# Patient Record
Sex: Male | Born: 1985 | Race: Black or African American | Hispanic: No | Marital: Single | Smoking: Current every day smoker
Health system: Southern US, Community
[De-identification: ages and names within clinical notes are randomized; demographics above are authoritative.]

## PROBLEM LIST (undated history)

## (undated) HISTORY — PX: ROOT CANAL: SHX2363

---
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
## 2007-02-23 ENCOUNTER — Emergency Department: Payer: Self-pay | Admitting: Emergency Medicine

## 2007-06-03 ENCOUNTER — Emergency Department: Payer: Self-pay | Admitting: Emergency Medicine

## 2007-08-30 ENCOUNTER — Emergency Department: Payer: Self-pay | Admitting: Unknown Physician Specialty

## 2007-09-25 ENCOUNTER — Emergency Department: Payer: Self-pay | Admitting: Emergency Medicine

## 2009-01-10 ENCOUNTER — Emergency Department: Payer: Self-pay | Admitting: Internal Medicine

## 2009-09-15 ENCOUNTER — Emergency Department: Payer: Self-pay | Admitting: Emergency Medicine

## 2010-07-22 ENCOUNTER — Emergency Department: Payer: Self-pay | Admitting: Emergency Medicine

## 2015-12-02 ENCOUNTER — Emergency Department
Admission: EM | Admit: 2015-12-02 | Discharge: 2015-12-02 | Disposition: A | Discharge status: Home / Self Care | Payer: Self-pay | Attending: Emergency Medicine | Admitting: Emergency Medicine

## 2015-12-02 DIAGNOSIS — F17210 Nicotine dependence, cigarettes, uncomplicated: Secondary | ICD-10-CM | POA: Insufficient documentation

## 2015-12-02 DIAGNOSIS — W57XXXA Bitten or stung by nonvenomous insect and other nonvenomous arthropods, initial encounter: Secondary | ICD-10-CM

## 2015-12-02 DIAGNOSIS — L03116 Cellulitis of left lower limb: Secondary | ICD-10-CM | POA: Insufficient documentation

## 2015-12-02 MED ORDER — NAPROXEN 500 MG PO TABS
500.0000 mg | ORAL_TABLET | Freq: Once | ORAL | Status: AC
  Administered 2015-12-02: 500 mg via ORAL
  Filled 2015-12-02: qty 1

## 2015-12-02 MED ORDER — NAPROXEN 500 MG PO TABS: 500.0000 mg | ORAL_TABLET | Freq: Two times a day (BID) | ORAL | Status: DC

## 2015-12-02 MED ORDER — SULFAMETHOXAZOLE-TRIMETHOPRIM 800-160 MG PO TABS: 1.0000 | ORAL_TABLET | Freq: Two times a day (BID) | ORAL | 0 refills | Status: DC

## 2015-12-02 MED ORDER — TRAMADOL HCL 50 MG PO TABS: 50.0000 mg | ORAL_TABLET | Freq: Four times a day (QID) | ORAL | 0 refills | Status: DC | PRN

## 2015-12-02 MED ORDER — SULFAMETHOXAZOLE-TRIMETHOPRIM 800-160 MG PO TABS
1.0000 | ORAL_TABLET | Freq: Once | ORAL | Status: AC
  Administered 2015-12-02: 1 via ORAL
  Filled 2015-12-02: qty 1

## 2015-12-02 NOTE — ED Triage Notes (Signed)
Pt reports he noticed a possible bite to left lateral calf Wednesday night. He "popped" bite and Thursday area became red/raised and painful

## 2015-12-02 NOTE — Discharge Instructions (Signed)
Apply warm compresses to the area for 5-10 minutes every 6 hours.

## 2015-12-02 NOTE — ED Provider Notes (Signed)
[facility] Emergency Department Provider Note   ____________________________________________   First MD Initiated Contact with Patient 12/02/15 1103     (approximate)  I have reviewed the triage vital signs and the nursing notes.   HISTORY  Chief Complaint No chief complaint on file.    HPI Ronald Baker is a 30 y.o. male patient complaining pain and edema to the left lower leg for 2 days. Patient suspect insect bite which occurred 3 days ago. Patient stated noticed pimple at the site and a tries to rupture the lesion. Patient state last night increased pain and edema. Patient rates his pain as a 7/10. No palliative measures taken for this complaint.   No past medical history on file.  There are no active problems to display for this patient.   No past surgical history on file.  Prior to Admission medications   Medication Sig Start Date End Date Taking? Authorizing Provider  naproxen (NAPROSYN) 500 MG tablet Take 1 tablet (500 mg total) by mouth 2 (two) times daily with a meal. 12/02/15   Joni Reiningonald K Ralphine Hinks, PA-C  sulfamethoxazole-trimethoprim (BACTRIM DS,SEPTRA DS) 800-160 MG tablet Take 1 tablet by mouth 2 (two) times daily. 12/02/15   Joni Reiningonald K Maire Govan, PA-C  traMADol (ULTRAM) 50 MG tablet Take 1 tablet (50 mg total) by mouth every 6 (six) hours as needed for moderate pain. 12/02/15   Joni Reiningonald K Evo Aderman, PA-C    Allergies Patient has no allergy information on record.  No family history on file.  Social History Social History  Substance Use Topics  . Smoking status: Not on file  . Smokeless tobacco: Not on file  . Alcohol use Not on file    Review of Systems Constitutional: No fever/chills Eyes: No visual changes. ENT: No sore throat. Cardiovascular: Denies chest pain. Respiratory: Denies shortness of breath. Gastrointestinal: No abdominal pain.  No nausea, no vomiting.  No diarrhea.  No constipation. Genitourinary: Negative for  dysuria. Musculoskeletal: Negative for back pain. Skin: Edema and redness to the lateral aspect of left lower leg.  Neurological: Negative for headaches, focal weakness or numbness.    ____________________________________________   PHYSICAL EXAM:  VITAL SIGNS: ED Triage Vitals  Enc Vitals Group     BP      Pulse      Resp      Temp      Temp src      SpO2      Weight      Height      Head Circumference      Peak Flow      Pain Score      Pain Loc      Pain Edu?      Excl. in GC?     Constitutional: Alert and oriented. Well appearing and in no acute distress. Eyes: Conjunctivae are normal. PERRL. EOMI. Head: Atraumatic. Nose: No congestion/rhinnorhea. Mouth/Throat: Mucous membranes are moist.  Oropharynx non-erythematous. Neck: No stridor. No cervical spine tenderness to palpation Hematological/Lymphatic/Immunilogical: No cervical lymphadenopathy. Cardiovascular: Normal rate, regular rhythm. Grossly normal heart sounds.  Good peripheral circulation. Respiratory: Normal respiratory effort.  No retractions. Lungs CTAB. Gastrointestinal: Soft and nontender. No distention. No abdominal bruits. No CVA tenderness. Musculoskeletal: No lower extremity tenderness nor edema.  No joint effusions. Neurologic:  Normal speech and language. No gross focal neurologic deficits are appreciated. No gait instability. Skin:  Skin is warm, dry and intact. No rash noted.Nonfluctuant nodule lesion lateral aspect of left lower leg. Psychiatric:  Mood and affect are normal. Speech and behavior are normal.  ____________________________________________   LABS (all labs ordered are listed, but only abnormal results are displayed)  Labs Reviewed - No data to display ____________________________________________  EKG   ____________________________________________  RADIOLOGY   ____________________________________________   PROCEDURES  Procedure(s) performed:  None  Procedures  Critical Care performed: No  ____________________________________________   INITIAL IMPRESSION / ASSESSMENT AND PLAN / ED COURSE  Pertinent labs & imaging results that were available during my care of the patient were reviewed by me and considered in my medical decision making (see chart for details).  Cellulitis secondary to suspect insect bite. Patient given discharge care instructions. Patient given prescription of Bactrim, naproxen, tramadol. Patient advised to follow-up with "clinic condition persists.  Clinical Course      ____________________________________________   FINAL CLINICAL IMPRESSION(S) / ED DIAGNOSES  Final diagnoses:  Bug bite with infection, initial encounter      NEW MEDICATIONS STARTED DURING THIS VISIT:  New Prescriptions   NAPROXEN (NAPROSYN) 500 MG TABLET    Take 1 tablet (500 mg total) by mouth 2 (two) times daily with a meal.   SULFAMETHOXAZOLE-TRIMETHOPRIM (BACTRIM DS,SEPTRA DS) 800-160 MG TABLET    Take 1 tablet by mouth 2 (two) times daily.   TRAMADOL (ULTRAM) 50 MG TABLET    Take 1 tablet (50 mg total) by mouth every 6 (six) hours as needed for moderate pain.     Note:  This document was prepared using Dragon voice recognition software and may include unintentional dictation errors.    Joni ReiningRonald K Makina Skow, PA-C 12/02/15 1112    Jennye MoccasinBrian S Quigley, MD 12/02/15 1248

## 2015-12-06 ENCOUNTER — Emergency Department
Admission: EM | Admit: 2015-12-06 | Discharge: 2015-12-06 | Disposition: A | Discharge status: Home / Self Care | Payer: Self-pay | Attending: Emergency Medicine | Admitting: Emergency Medicine

## 2015-12-06 ENCOUNTER — Encounter: Payer: Self-pay | Admitting: Emergency Medicine

## 2015-12-06 DIAGNOSIS — F17210 Nicotine dependence, cigarettes, uncomplicated: Secondary | ICD-10-CM | POA: Insufficient documentation

## 2015-12-06 DIAGNOSIS — L03116 Cellulitis of left lower limb: Secondary | ICD-10-CM

## 2015-12-06 LAB — BASIC METABOLIC PANEL
Anion gap: 7 (ref 5–15)
BUN: 9 mg/dL (ref 6–20)
CHLORIDE: 102 mmol/L (ref 101–111)
CO2: 26 mmol/L (ref 22–32)
CREATININE: 1.17 mg/dL (ref 0.61–1.24)
Calcium: 9.7 mg/dL (ref 8.9–10.3)
Glucose, Bld: 97 mg/dL (ref 65–99)
POTASSIUM: 4.2 mmol/L (ref 3.5–5.1)
SODIUM: 135 mmol/L (ref 135–145)

## 2015-12-06 LAB — CBC WITH DIFFERENTIAL/PLATELET
BASOS ABS: 0 10*3/uL (ref 0–0.1)
Basophils Relative: 0 %
EOS ABS: 0 10*3/uL (ref 0–0.7)
Eosinophils Relative: 0 %
HCT: 42.9 % (ref 40.0–52.0)
HEMOGLOBIN: 14.6 g/dL (ref 13.0–18.0)
LYMPHS ABS: 1.2 10*3/uL (ref 1.0–3.6)
Lymphocytes Relative: 16 %
MCH: 30.3 pg (ref 26.0–34.0)
MCHC: 34 g/dL (ref 32.0–36.0)
MCV: 89 fL (ref 80.0–100.0)
Monocytes Absolute: 0.7 10*3/uL (ref 0.2–1.0)
Monocytes Relative: 9 %
NEUTROS PCT: 75 %
Neutro Abs: 5.8 10*3/uL (ref 1.4–6.5)
Platelets: 251 10*3/uL (ref 150–440)
RBC: 4.82 MIL/uL (ref 4.40–5.90)
RDW: 13.1 % (ref 11.5–14.5)
WBC: 7.8 10*3/uL (ref 3.8–10.6)

## 2015-12-06 MED ORDER — CLINDAMYCIN HCL 300 MG PO CAPS: 300.0000 mg | ORAL_CAPSULE | Freq: Three times a day (TID) | ORAL | 0 refills | Status: DC

## 2015-12-06 MED ORDER — TRAMADOL HCL 50 MG PO TABS: 50.0000 mg | ORAL_TABLET | Freq: Four times a day (QID) | ORAL | 0 refills | Status: DC | PRN

## 2015-12-06 MED ORDER — CLINDAMYCIN PHOSPHATE 600 MG/50ML IV SOLN
600.0000 mg | Freq: Once | INTRAVENOUS | Status: AC
  Administered 2015-12-06: 600 mg via INTRAVENOUS
  Filled 2015-12-06: qty 50

## 2015-12-06 NOTE — ED Provider Notes (Signed)
[facility] Emergency Department Provider Note  ____________________________________________  Time seen: Approximately 9:45 AM  I have reviewed the triage vital signs and the nursing notes.   HISTORY  Chief Complaint Abscess    HPI Ronald Baker is a 30 y.o. male , NAD, presents to the emergency department with redness and swelling around a skin sore about his left lower leg. Patient states she was seen in the emergency department a few days ago for what he thought was a spider bite. Was prescribed antibiotics and pain medications at that time. States the area has begun to ooze but has more redness, pain and swelling than it did previously. Has been taking his medications as prescribed. Denies any chest pain, shortness breath, abdominal pain, nausea or vomiting. Has had no swelling about his feet or toes. Denies any fevers, chills or body aches. Has not had follow-up since being seen in this emergency department a few days ago. Denies any new injuries or traumas.   History reviewed. No pertinent past medical history.  There are no active problems to display for this patient.   Past Surgical History:  Procedure Laterality Date  . ROOT CANAL      Prior to Admission medications   Medication Sig Start Date End Date Taking? Authorizing Provider  clindamycin (CLEOCIN) 300 MG capsule Take 1 capsule (300 mg total) by mouth 3 (three) times daily. 12/06/15   Mkayla Steele L Michelle Vanhise, PA-C  naproxen (NAPROSYN) 500 MG tablet Take 1 tablet (500 mg total) by mouth 2 (two) times daily with a meal. 12/02/15   Joni Reiningonald K Smith, PA-C  traMADol (ULTRAM) 50 MG tablet Take 1 tablet (50 mg total) by mouth every 6 (six) hours as needed. 12/06/15   Dameka Younker L Chalese Peach, PA-C    Allergies Patient has no known allergies.  Family History  Problem Relation Age of Onset  . Cancer Mother     Social History Social History  Substance Use Topics  . Smoking status: Current Every Day Smoker   Packs/day: 1.00    Years: 10.00    Types: Cigarettes  . Smokeless tobacco: Never Used  . Alcohol use 2.4 oz/week    2 Shots of liquor, 2 Cans of beer per week     Review of Systems  Constitutional: No fever/chills Cardiovascular: No chest pain. Respiratory:No shortness of breath. No wheezing.  Gastrointestinal: No abdominal pain.  No nausea, vomiting.   Musculoskeletal: Positive left lower leg pain associated with skin sore.  Skin: Positive skin sore with surrounding redness and swelling about the left lower leg with active oozing. No new rashes, skin sores, bruising. Neurological: Negative for numbness, weakness, tingling. 10-point ROS otherwise negative.  ____________________________________________   PHYSICAL EXAM:  VITAL SIGNS: ED Triage Vitals  Enc Vitals Group     BP      Pulse      Resp      Temp      Temp src      SpO2      Weight      Height      Head Circumference      Peak Flow      Pain Score      Pain Loc      Pain Edu?      Excl. in GC?     Constitutional: Alert and oriented. Well appearing and in no acute distress. Eyes: Conjunctivae are normal Without icterus or injection Head: Atraumatic. Cardiovascular: Normal rate, regular rhythm. Normal S1 and S2.  No murmurs, rubs, gallops. Good peripheral circulation With 2+ pulses noted in the left lower extremity. Capillary refill is brisk in all digits of left foot. Respiratory: Normal respiratory effort without tachypnea or retractions. Lungs CTAB with breath sounds noted in all lung fields. Musculoskeletal: No lower extremity edema.  No joint effusions.Compartments about the left lower extremity are soft. Neurologic:  Normal speech and language. No gross focal neurologic deficits are appreciated.  Skin:  0.8cm oozing skin sore noted about the left lateral lower leg. Surrounding erythema and cellulitis is noted without induration or fluctuance measuring approximately 4 cm in diameter. Skin is warm, dry. No  rash noted. Psychiatric: Mood and affect are normal. Speech and behavior are normal. Patient exhibits appropriate insight and judgement.   ____________________________________________   LABS (all labs ordered are listed, but only abnormal results are displayed)  Labs Reviewed  BASIC METABOLIC PANEL  CBC WITH DIFFERENTIAL/PLATELET   ____________________________________________  EKG  None ____________________________________________  RADIOLOGY  None ____________________________________________    PROCEDURES  Procedure(s) performed: None   Procedures   Medications  clindamycin (CLEOCIN) IVPB 600 mg (0 mg Intravenous Stopped 12/06/15 1129)     ____________________________________________   INITIAL IMPRESSION / ASSESSMENT AND PLAN / ED COURSE  Pertinent labs & imaging results that were available during my care of the patient were reviewed by me and considered in my medical decision making (see chart for details).  Clinical Course     Patient's diagnosis is consistent with Cellulitis of left lower show many. Laboratory results were reassuring and all within normal limits. Patient's physical exam is consistent with cellulitis without abscess that needed incision and drainage. Patient tolerated IV clindamycin in the emergency department well without immediate side effects. We'll change at home prescriptions to clindamycin as well as refill tramadol to take as needed for pain. Patient verbalizes understanding that he will [facility]. Permanent marker was used to outline the area of redness and swelling.  Patient is given ED precautions to return to the ED for any worsening or new symptoms.    ____________________________________________  FINAL CLINICAL IMPRESSION(S) / ED DIAGNOSES  Final diagnoses:  Cellulitis of left lower extremity      NEW MEDICATIONS STARTED DURING THIS VISIT:  New Prescriptions    CLINDAMYCIN (CLEOCIN) 300 MG CAPSULE    Take 1 capsule (300 mg total) by mouth 3 (three) times daily.   TRAMADOL (ULTRAM) 50 MG TABLET    Take 1 tablet (50 mg total) by mouth every 6 (six) hours as needed.         Hope PigeonJami L Cybele Maule, PA-C 12/06/15 1136    Sharman CheekPhillip Stafford, MD 12/07/15 [phone]

## 2015-12-06 NOTE — ED Triage Notes (Signed)
Possible insect bite to left lower leg about week ago  Area is larger and more swollen  Min draining noted

## 2016-05-20 ENCOUNTER — Encounter (HOSPITAL_COMMUNITY): Payer: Self-pay | Admitting: Emergency Medicine

## 2016-05-20 ENCOUNTER — Emergency Department (HOSPITAL_COMMUNITY)
Admission: EM | Admit: 2016-05-20 | Discharge: 2016-05-20 | Disposition: A | Discharge status: Home / Self Care | Payer: Self-pay | Attending: Emergency Medicine | Admitting: Emergency Medicine

## 2016-05-20 DIAGNOSIS — Y999 Unspecified external cause status: Secondary | ICD-10-CM | POA: Insufficient documentation

## 2016-05-20 DIAGNOSIS — Y929 Unspecified place or not applicable: Secondary | ICD-10-CM | POA: Insufficient documentation

## 2016-05-20 DIAGNOSIS — S2195XA Open bite of unspecified part of thorax, initial encounter: Secondary | ICD-10-CM | POA: Insufficient documentation

## 2016-05-20 DIAGNOSIS — W57XXXA Bitten or stung by nonvenomous insect and other nonvenomous arthropods, initial encounter: Secondary | ICD-10-CM | POA: Insufficient documentation

## 2016-05-20 DIAGNOSIS — F17210 Nicotine dependence, cigarettes, uncomplicated: Secondary | ICD-10-CM | POA: Insufficient documentation

## 2016-05-20 DIAGNOSIS — Y939 Activity, unspecified: Secondary | ICD-10-CM | POA: Insufficient documentation

## 2016-05-20 NOTE — ED Provider Notes (Signed)
[facility]-EMERGENCY DEPT Provider Note    By signing my name below, I, Earmon Phoenix, attest that this documentation has been prepared under the direction and in the presence of Terance Hart, PA-C. Electronically Signed: Earmon Phoenix, ED Scribe. 05/20/16. 1:05 PM.    History   Chief Complaint Chief Complaint  Patient presents with  . Insect Bite   The history is provided by the patient and medical records. No language interpreter was used.    Ronald Baker is a 31 y.o. male who presents to the Emergency Department complaining of an insect bites to the left lateral chest wall and to the right fifth digit and hand that occurred yesterday. He reports associated itching and pain around the areas. He states he is currently staying at his sister's house but denies anyone else in the house has bites. There are no animals in the house. He has not taken anything for symptom relief. There are no modifying factors noted. He denies fever, chills, nausea, vomiting or open wounds.   History reviewed. No pertinent past medical history.  There are no active problems to display for this patient.   Past Surgical History:  Procedure Laterality Date  . ROOT CANAL         Home Medications    Prior to Admission medications   Medication Sig Start Date End Date Taking? Authorizing Provider  clindamycin (CLEOCIN) 300 MG capsule Take 1 capsule (300 mg total) by mouth 3 (three) times daily. 12/06/15   Hagler, Jami L, PA-C  naproxen (NAPROSYN) 500 MG tablet Take 1 tablet (500 mg total) by mouth 2 (two) times daily with a meal. 12/02/15   Joni Reining, PA-C  traMADol (ULTRAM) 50 MG tablet Take 1 tablet (50 mg total) by mouth every 6 (six) hours as needed. 12/06/15   Hagler, Ernestene Kiel, PA-C    Family History Family History  Problem Relation Age of Onset  . Cancer Mother     Social History Social History  Substance Use Topics  . Smoking status: Current Every Day Smoker    Packs/day: 1.00   Years: 10.00    Types: Cigarettes  . Smokeless tobacco: Never Used  . Alcohol use 2.4 oz/week    2 Shots of liquor, 2 Cans of beer per week     Allergies   Patient has no known allergies.   Review of Systems Review of Systems  Constitutional: Negative for chills and fever.  Gastrointestinal: Negative for nausea and vomiting.  Skin: Positive for color change. Negative for wound.     Physical Exam Updated Vital Signs BP (!) 136/91   Pulse 87   Temp 98.3 F (36.8 C) (Oral)   Resp 18   Ht 5\' 9"  (1.753 m)   Wt 161 lb (73 kg)   SpO2 100%   BMI 23.78 kg/m   Physical Exam  Constitutional: He is oriented to person, place, and time. He appears well-developed and well-nourished. No distress.  HENT:  Head: Normocephalic and atraumatic.  Eyes: Conjunctivae are normal. Pupils are equal, round, and reactive to light. Right eye exhibits no discharge. Left eye exhibits no discharge. No scleral icterus.  Neck: Normal range of motion.  Cardiovascular: Normal rate.   Pulmonary/Chest: Effort normal. No respiratory distress.  Abdominal: He exhibits no distension.  Musculoskeletal: Normal range of motion.  Neurological: He is alert and oriented to person, place, and time.  Skin: Skin is warm and dry.  Scattered, erythematous papules of the dorsal right hand consistent with bite marks.  Bite mark to left lateral chest wall.  Psychiatric: He has a normal mood and affect. His behavior is normal.  Nursing note and vitals reviewed.    ED Treatments / Results  DIAGNOSTIC STUDIES: Oxygen Saturation is 100% on RA, normal by my interpretation.   COORDINATION OF CARE: 1:02 PM- Recommended OTC Benadryl and OTC steroid cream. Instructed pt to apply ice to the areas to help reduce pain and itching. Pt verbalizes understanding and agrees to plan.  Medications - No data to display  Labs (all labs ordered are listed, but only abnormal results are displayed) Labs Reviewed - No data to  display  EKG  EKG Interpretation None       Radiology No results found.  Procedures Procedures (including critical care time)  Medications Ordered in ED Medications - No data to display   Initial Impression / Assessment and Plan / ED Course  I have reviewed the triage vital signs and the nursing notes.  Pertinent labs & imaging results that were available during my care of the patient were reviewed by me and considered in my medical decision making (see chart for details).  31 year old male with multiple insect bite wounds. Advised Benadryl and steroid cream for itching/inflammation as well as cool compresses. Return precautions given.   Final Clinical Impressions(s) / ED Diagnoses   Final diagnoses:  Insect bite, initial encounter    New Prescriptions New Prescriptions   No medications on file   I personally performed the services described in this documentation, which was scribed in my presence. The recorded information has been reviewed and is accurate.    Bethel BornGekas, Elsye Mccollister Marie, PA-C 05/20/16 1338    Raeford RazorKohut, Stephen, MD 05/20/16 [phone]

## 2016-05-20 NOTE — Discharge Instructions (Signed)
Use Hydrocortisone 1% cream twice a day on bites Take Benadryl for itching Use ice to reduce swelling and redness

## 2016-05-20 NOTE — ED Triage Notes (Signed)
Pt reports noticed a bug bit to his left side early yesterday, today pt woke up with redness to right pinky finger from another possible bug bite.

## 2016-05-20 NOTE — ED Notes (Signed)
Declined W/C at D/C and was escorted to lobby by RN. 

## 2017-05-20 ENCOUNTER — Emergency Department: Payer: Self-pay

## 2017-05-20 ENCOUNTER — Encounter: Payer: Self-pay | Admitting: Emergency Medicine

## 2017-05-20 ENCOUNTER — Emergency Department
Admission: EM | Admit: 2017-05-20 | Discharge: 2017-05-20 | Disposition: A | Discharge status: Home / Self Care | Payer: Self-pay | Attending: Emergency Medicine | Admitting: Emergency Medicine

## 2017-05-20 ENCOUNTER — Other Ambulatory Visit: Payer: Self-pay

## 2017-05-20 DIAGNOSIS — S60221A Contusion of right hand, initial encounter: Secondary | ICD-10-CM | POA: Insufficient documentation

## 2017-05-20 DIAGNOSIS — Y999 Unspecified external cause status: Secondary | ICD-10-CM | POA: Insufficient documentation

## 2017-05-20 DIAGNOSIS — W208XXA Other cause of strike by thrown, projected or falling object, initial encounter: Secondary | ICD-10-CM | POA: Insufficient documentation

## 2017-05-20 DIAGNOSIS — Y929 Unspecified place or not applicable: Secondary | ICD-10-CM | POA: Insufficient documentation

## 2017-05-20 DIAGNOSIS — F17210 Nicotine dependence, cigarettes, uncomplicated: Secondary | ICD-10-CM | POA: Insufficient documentation

## 2017-05-20 DIAGNOSIS — Y939 Activity, unspecified: Secondary | ICD-10-CM | POA: Insufficient documentation

## 2017-05-20 DIAGNOSIS — Z79899 Other long term (current) drug therapy: Secondary | ICD-10-CM | POA: Insufficient documentation

## 2017-05-20 MED ORDER — MELOXICAM 15 MG PO TABS: 15.0000 mg | ORAL_TABLET | Freq: Every day | ORAL | 1 refills | Status: AC

## 2017-05-20 NOTE — ED Triage Notes (Signed)
Pt to ED from home c/o right hand injury that happened Saturday but has not seen resolve.  States car rim came down on top of patient's hand causing injury to first two proximal knuckles right hand, states swelling and abrasion.

## 2017-05-20 NOTE — ED Provider Notes (Signed)
[facility] Emergency Department Provider Note  ____________________________________________  Time seen: Approximately 3:20 PM  I have reviewed the triage vital signs and the nursing notes.   HISTORY  Chief Complaint Hand Pain    HPI Ronald Baker is a 32 y.o. male presents to the emergency department with 10 out of 10 right second and third digit pain after patient reports that he was working on his car and a car rim dropped on his hand.  Patient has not experienced right hand avoidance.  He has kept right second and third digits splinted since Saturday and has noticed some stiffness since splinting has occurred.  No weakness or changes in sensation in the upper extremities.  Patient did sustain 2 small abrasions along the dorsal aspect of the affected digits.   History reviewed. No pertinent past medical history.  There are no active problems to display for this patient.   Past Surgical History:  Procedure Laterality Date  . ROOT CANAL      Prior to Admission medications   Medication Sig Start Date End Date Taking? Authorizing Provider  clindamycin (CLEOCIN) 300 MG capsule Take 1 capsule (300 mg total) by mouth 3 (three) times daily. 12/06/15   Hagler, Jami L, PA-C  meloxicam (MOBIC) 15 MG tablet Take 1 tablet (15 mg total) by mouth daily for 7 days. 05/20/17 05/27/17  Orvil Feil, PA-C  naproxen (NAPROSYN) 500 MG tablet Take 1 tablet (500 mg total) by mouth 2 (two) times daily with a meal. 12/02/15   Joni Reining, PA-C  traMADol (ULTRAM) 50 MG tablet Take 1 tablet (50 mg total) by mouth every 6 (six) hours as needed. 12/06/15   Hagler, Jami L, PA-C    Allergies Patient has no known allergies.  Family History  Problem Relation Age of Onset  . Cancer Mother     Social History Social History   Tobacco Use  . Smoking status: Current Every Day Smoker    Packs/day: 1.00    Years: 10.00    Pack years: 10.00    Types: Cigarettes  .  Smokeless tobacco: Never Used  Substance Use Topics  . Alcohol use: Yes    Alcohol/week: 2.4 oz    Types: 2 Shots of liquor, 2 Cans of beer per week  . Drug use: No     Review of Systems  Constitutional: No fever/chills Eyes: No visual changes. No discharge ENT: No upper respiratory complaints. Cardiovascular: no chest pain. Respiratory: no cough. No SOB. Gastrointestinal: No abdominal pain.  No nausea, no vomiting.  No diarrhea.  No constipation. Genitourinary: Negative for dysuria. No hematuria Musculoskeletal: Patient has right hand pain.  Skin: Patient has abrasions.  Neurological: Negative for headaches, focal weakness or numbness.   ____________________________________________   PHYSICAL EXAM:  VITAL SIGNS: ED Triage Vitals  Enc Vitals Group     BP 05/20/17 1436 133/82     Pulse Rate 05/20/17 1436 68     Resp 05/20/17 1436 14     Temp 05/20/17 1436 98.1 F (36.7 C)     Temp Source 05/20/17 1436 Oral     SpO2 05/20/17 1436 100 %     Weight 05/20/17 1437 165 lb (74.8 kg)     Height 05/20/17 1437  (1.753 m)     Head Circumference --      Peak Flow --      Pain Score 05/20/17 1439 8     Pain Loc --  Pain Edu? --      Excl. in GC? --      Constitutional: Alert and oriented. Well appearing and in no acute distress. Eyes: Conjunctivae are normal. PERRL. EOMI. Head: Atraumatic. Cardiovascular: Normal rate, regular rhythm. Normal S1 and S2.  Good peripheral circulation. Respiratory: Normal respiratory effort without tachypnea or retractions. Lungs CTAB. Good air entry to the bases with no decreased or absent breath sounds. Gastrointestinal: Bowel sounds 4 quadrants. Soft and nontender to palpation. No guarding or rigidity. No palpable masses. No distention. No CVA tenderness. Musculoskeletal: Full range of motion to all extremities. No gross deformities appreciated.  No flexor or extensor tendon deficits appreciated. Neurologic:  Normal speech and  language. No gross focal neurologic deficits are appreciated.  Skin: Patient has abrasions to dorsal aspect of second and third right digits of the skin overlying the PIP joints. Psychiatric: Mood and affect are normal. Speech and behavior are normal. Patient exhibits appropriate insight and judgement.   ____________________________________________   LABS (all labs ordered are listed, but only abnormal results are displayed)  Labs Reviewed - No data to display ____________________________________________  EKG   ____________________________________________  RADIOLOGY Geraldo Pitter, personally viewed and evaluated these images (plain radiographs) as part of my medical decision making, as well as reviewing the written report by the radiologist.    Dg Hand Complete Right  Result Date: 05/20/2017 CLINICAL DATA:  Right hand pain after injury. EXAM: RIGHT HAND - COMPLETE 3+ VIEW COMPARISON:  Radiographs of Jun 03, 2007. FINDINGS: There is no evidence of fracture or dislocation. There is no evidence of arthropathy or other focal bone abnormality. Soft tissues are unremarkable. IMPRESSION: Normal right hand. Electronically Signed   By: Lupita Raider, M.D.   On: 05/20/2017 15:24    ____________________________________________    PROCEDURES  Procedure(s) performed:    Procedures    Medications - No data to display   ____________________________________________   INITIAL IMPRESSION / ASSESSMENT AND PLAN / ED COURSE  Pertinent labs & imaging results that were available during my care of the patient were reviewed by me and considered in my medical decision making (see chart for details).  Review of the [place] CSRS was performed in accordance of the [facility] prior to dispensing any controlled drugs.     Assessment and Plan: Right hand pain:  Patient presents to the emergency department with right hand pain after a car part dropped on his hand.  Differential diagnosis included  fracture versus contusion.  Physical exam findings were reassuring in the emergency department.  Patient was discharged with meloxicam and advised to follow-up with Dr. Stephenie Acres.  Vital signs are reassuring prior to discharge.  ____________________________________________  FINAL CLINICAL IMPRESSION(S) / ED DIAGNOSES  Final diagnoses:  Contusion of right hand, initial encounter      NEW MEDICATIONS STARTED DURING THIS VISIT:  ED Discharge Orders        Ordered    meloxicam (MOBIC) 15 MG tablet  Daily     05/20/17 1530          This chart was dictated using voice recognition software/Dragon. Despite best efforts to proofread, errors can occur which can change the meaning. Any change was purely unintentional.    Orvil Feil, PA-C 05/20/17 1534    Sharman Cheek, MD 05/22/17 [phone]

## 2018-10-10 ENCOUNTER — Other Ambulatory Visit: Payer: Self-pay

## 2018-10-10 DIAGNOSIS — Z20822 Contact with and (suspected) exposure to covid-19: Secondary | ICD-10-CM

## 2018-10-11 LAB — NOVEL CORONAVIRUS, NAA: SARS-CoV-2, NAA: NOT DETECTED

## 2019-03-08 ENCOUNTER — Emergency Department
Admission: EM | Admit: 2019-03-08 | Discharge: 2019-03-08 | Disposition: A | Discharge status: Home / Self Care | Payer: Self-pay | Attending: Emergency Medicine | Admitting: Emergency Medicine

## 2019-03-08 ENCOUNTER — Encounter: Payer: Self-pay | Admitting: Emergency Medicine

## 2019-03-08 ENCOUNTER — Other Ambulatory Visit: Payer: Self-pay

## 2019-03-08 DIAGNOSIS — L0231 Cutaneous abscess of buttock: Secondary | ICD-10-CM | POA: Insufficient documentation

## 2019-03-08 DIAGNOSIS — F17210 Nicotine dependence, cigarettes, uncomplicated: Secondary | ICD-10-CM | POA: Insufficient documentation

## 2019-03-08 MED ORDER — OXYCODONE-ACETAMINOPHEN 5-325 MG PO TABS
1.0000 | ORAL_TABLET | Freq: Once | ORAL | Status: AC
  Administered 2019-03-08: 15:00:00 1 via ORAL
  Filled 2019-03-08: qty 1

## 2019-03-08 MED ORDER — HYDROCODONE-ACETAMINOPHEN 5-325 MG PO TABS: 1.0000 | ORAL_TABLET | Freq: Three times a day (TID) | ORAL | 0 refills | Status: AC | PRN

## 2019-03-08 MED ORDER — SULFAMETHOXAZOLE-TRIMETHOPRIM 800-160 MG PO TABS: 1.0000 | ORAL_TABLET | Freq: Two times a day (BID) | ORAL | 0 refills | Status: DC

## 2019-03-08 MED ORDER — LIDOCAINE HCL (PF) 1 % IJ SOLN
5.0000 mL | Freq: Once | INTRAMUSCULAR | Status: AC
  Administered 2019-03-08: 5 mL
  Filled 2019-03-08: qty 5

## 2019-03-08 MED ORDER — SULFAMETHOXAZOLE-TRIMETHOPRIM 800-160 MG PO TABS
1.0000 | ORAL_TABLET | Freq: Once | ORAL | Status: AC
  Administered 2019-03-08: 15:00:00 1 via ORAL
  Filled 2019-03-08: qty 1

## 2019-03-08 NOTE — Discharge Instructions (Signed)
You have had your buttock abscess opened and drained. A small piece of ribbon has been placed to help with healing. Keep the area clean, dry, and covered. Apply warm compresses over the dressing to promote healing. Follow-up with this ED in 3 days for packing removal, or [facility] as needed. Take the antibiotic as directed and the pain medicine as needed.

## 2019-03-08 NOTE — ED Provider Notes (Signed)
[facility] Emergency Department Provider Note ____________________________________________  Time seen: 17  I have reviewed the triage vital signs and the nursing notes.  HISTORY  Chief Complaint  Abscess  HPI Ronald Baker is a 34 y.o. male presents to the ED for evaluation of an abscess to the right buttocks.  Patient presents to the ED with increasing area of tenderness and firmness to the right buttocks.  He describes the area began as a small bump.  He attempted to squeeze the area yesterday but did not report any significant drainage.  He did have some relief of his acute pain.  He does also admit that since that time has had spread of the area of firmness on his right buttocks.  Denies any fevers, chills, or sweats.  He also denies any history of previous abscesses or boils to the skin.   History reviewed. No pertinent past medical history.  There are no problems to display for this patient.   Past Surgical History:  Procedure Laterality Date  . ROOT CANAL      Prior to Admission medications   Medication Sig Start Date End Date Taking? Authorizing Provider  clindamycin (CLEOCIN) 300 MG capsule Take 1 capsule (300 mg total) by mouth 3 (three) times daily. 12/06/15   Hagler, Jami L, PA-C  HYDROcodone-acetaminophen (NORCO) 5-325 MG tablet Take 1 tablet by mouth 3 (three) times daily as needed for up to 2 days. 03/08/19 03/10/19  Deondre Marinaro, Dannielle Karvonen, PA-C  naproxen (NAPROSYN) 500 MG tablet Take 1 tablet (500 mg total) by mouth 2 (two) times daily with a meal. 12/02/15   Sable Feil, PA-C  sulfamethoxazole-trimethoprim (BACTRIM DS) 800-160 MG tablet Take 1 tablet by mouth 2 (two) times daily. 03/08/19   Deacon Gadbois, Dannielle Karvonen, PA-C  traMADol (ULTRAM) 50 MG tablet Take 1 tablet (50 mg total) by mouth every 6 (six) hours as needed. 12/06/15   Hagler, Jami L, PA-C    Allergies Patient has no known allergies.  Family History  Problem Relation Age of  Onset  . Cancer Mother     Social History Social History   Tobacco Use  . Smoking status: Current Every Day Smoker    Packs/day: 1.00    Years: 10.00    Pack years: 10.00    Types: Cigarettes  . Smokeless tobacco: Never Used  Substance Use Topics  . Alcohol use: Yes    Alcohol/week: 4.0 standard drinks    Types: 2 Shots of liquor, 2 Cans of beer per week  . Drug use: No    Review of Systems  Constitutional: Negative for fever. Cardiovascular: Negative for chest pain. Respiratory: Negative for shortness of breath. Gastrointestinal: Negative for abdominal pain, vomiting and diarrhea. Genitourinary: Negative for dysuria. Musculoskeletal: Negative for back pain. Skin: Negative for rash.  Right buttocks abscess as above. Neurological: Negative for headaches, focal weakness or numbness. ____________________________________________  PHYSICAL EXAM:  VITAL SIGNS: ED Triage Vitals  Enc Vitals Group     BP 03/08/19 1341 136/72     Pulse Rate 03/08/19 1341 93     Resp 03/08/19 1341 18     Temp 03/08/19 1341 98.8 F (37.1 C)     Temp Source 03/08/19 1633 Oral     SpO2 03/08/19 1341 100 %     Weight 03/08/19 1342 150 lb (68 kg)     Height 03/08/19 1342 5\' 9"  (1.753 m)     Head Circumference --      Peak Flow --  Pain Score 03/08/19 1342 10     Pain Loc --      Pain Edu? --      Excl. in GC? --     Constitutional: Alert and oriented. Well appearing and in no distress. Head: Normocephalic and atraumatic. Eyes: Conjunctivae are normal. Normal extraocular movements Cardiovascular: Normal rate, regular rhythm. Normal distal pulses. Respiratory: Normal respiratory effort.  Musculoskeletal: Nontender with normal range of motion in all extremities.  Neurologic:  Normal gait without ataxia. Normal speech and language. No gross focal neurologic deficits are appreciated. Skin:  Skin is warm, dry and intact. No rash noted.  Right buttocks with a focal area of induration with a  central scab.  There is some mild pointing but no appreciable fluctuance in the area.  No extension towards the gluteal cleft. ____________________________________________  PROCEDURES  Oxycodone 5-325 mg p.o.  Marland Kitchen.Incision and Drainage  Date/Time: 03/08/2019 3:32 PM Performed by: Lissa Hoard, PA-C Authorized by: Lissa Hoard, PA-C   Consent:    Consent obtained:  Verbal   Consent given by:  Patient   Risks discussed:  Bleeding and pain   Alternatives discussed:  Alternative treatment Location:    Type:  Abscess   Location:  Anogenital   Anogenital location: Right Buttocks. Pre-procedure details:    Skin preparation:  Betadine Anesthesia (see MAR for exact dosages):    Anesthesia method:  Local infiltration   Local anesthetic:  Lidocaine 1% w/o epi Procedure type:    Complexity:  Simple Procedure details:    Needle aspiration: no     Incision types:  Single straight   Incision depth:  Subcutaneous   Scalpel blade:  11   Wound management:  Probed and deloculated and irrigated with saline   Drainage:  Bloody and purulent   Drainage amount:  Moderate   Packing materials:  1/4 in iodoform gauze   Amount 1/4" iodoform:  5 Post-procedure details:    Patient tolerance of procedure:  Tolerated well, no immediate complications  ____________________________________________  INITIAL IMPRESSION / ASSESSMENT AND PLAN / ED COURSE  Patient with ED evaluation of an acute abscess to the right buttocks.  Patient presents for management of his symptoms.  The local skin infection is I&D in the ED.  A moderate amount of bloody purulent drainage is achieved.  The area is probed and deloculated.  Iodoform packing is placed and the patient is discharged with wound care instructions.  Wound care supplies also provided.  A prescription for Bactrim as well as a small prescription for hydrocodone was provided for his benefit.  He will return to the ED in 3 days for wound  check.  Ronald Baker was evaluated in Emergency Department on 03/08/2019 for the symptoms described in the history of present illness. He was evaluated in the context of the global COVID-19 pandemic, which necessitated consideration that the patient might be at risk for infection with the SARS-CoV-2 virus that causes COVID-19. Institutional protocols and algorithms that pertain to the evaluation of patients at risk for COVID-19 are in a state of rapid change based on information released by regulatory bodies including the CDC and federal and state organizations. These policies and algorithms were followed during the patient's care in the ED.  I reviewed the patient's prescription history over the last 12 months in the multi-state controlled substances database(s) that includes [place], [place], [place], [place], [place], [place], [place], [place], [place], [place], [place], [place], [place], and [place].  Results  were notable for no RX history.  ____________________________________________  FINAL CLINICAL IMPRESSION(S) / ED DIAGNOSES  Final diagnoses:  Abscess of buttock, right      Naoma Boxell, Charlesetta Ivory, PA-C 03/08/19 2055    Dionne Bucy, MD 03/09/19 1458

## 2019-03-08 NOTE — ED Triage Notes (Signed)
FIRST NURSE NOTE:  Pt here for "boil" to buttocks, pt placed in wheelchair, pt unable to sit on that side due to the pain.

## 2019-03-08 NOTE — ED Triage Notes (Signed)
Pt to ER with c/o abscess to right buttock.  Area noted to be warm and hard.  Pt states first noted 2 days ago.

## 2019-06-11 ENCOUNTER — Other Ambulatory Visit: Payer: Self-pay

## 2019-06-11 ENCOUNTER — Encounter: Payer: Self-pay | Admitting: Emergency Medicine

## 2019-06-11 DIAGNOSIS — F17210 Nicotine dependence, cigarettes, uncomplicated: Secondary | ICD-10-CM | POA: Insufficient documentation

## 2019-06-11 DIAGNOSIS — L02413 Cutaneous abscess of right upper limb: Secondary | ICD-10-CM | POA: Insufficient documentation

## 2019-06-11 LAB — CBC WITH DIFFERENTIAL/PLATELET
Abs Immature Granulocytes: 0.02 10*3/uL (ref 0.00–0.07)
Basophils Absolute: 0 10*3/uL (ref 0.0–0.1)
Basophils Relative: 0 %
Eosinophils Absolute: 0 10*3/uL (ref 0.0–0.5)
Eosinophils Relative: 0 %
HCT: 41.3 % (ref 39.0–52.0)
Hemoglobin: 13.7 g/dL (ref 13.0–17.0)
Immature Granulocytes: 0 %
Lymphocytes Relative: 23 %
Lymphs Abs: 1.5 10*3/uL (ref 0.7–4.0)
MCH: 30.4 pg (ref 26.0–34.0)
MCHC: 33.2 g/dL (ref 30.0–36.0)
MCV: 91.6 fL (ref 80.0–100.0)
Monocytes Absolute: 0.5 10*3/uL (ref 0.1–1.0)
Monocytes Relative: 8 %
Neutro Abs: 4.5 10*3/uL (ref 1.7–7.7)
Neutrophils Relative %: 69 %
Platelets: 254 10*3/uL (ref 150–400)
RBC: 4.51 MIL/uL (ref 4.22–5.81)
RDW: 12.5 % (ref 11.5–15.5)
WBC: 6.6 10*3/uL (ref 4.0–10.5)
nRBC: 0 % (ref 0.0–0.2)

## 2019-06-11 LAB — COMPREHENSIVE METABOLIC PANEL
ALT: 12 U/L (ref 0–44)
AST: 19 U/L (ref 15–41)
Albumin: 4.4 g/dL (ref 3.5–5.0)
Alkaline Phosphatase: 66 U/L (ref 38–126)
Anion gap: 9 (ref 5–15)
BUN: 11 mg/dL (ref 6–20)
CO2: 29 mmol/L (ref 22–32)
Calcium: 9.3 mg/dL (ref 8.9–10.3)
Chloride: 102 mmol/L (ref 98–111)
Creatinine, Ser: 1.12 mg/dL (ref 0.61–1.24)
GFR calc Af Amer: >60 mL/min (ref >60)
GFR calc non Af Amer: >60 mL/min (ref >60)
Glucose, Bld: 93 mg/dL (ref 70–99)
Potassium: 3.9 mmol/L (ref 3.5–5.1)
Sodium: 140 mmol/L (ref 135–145)
Total Bilirubin: 0.7 mg/dL (ref 0.3–1.2)
Total Protein: 8.1 g/dL (ref 6.5–8.1)

## 2019-06-11 LAB — LACTIC ACID, PLASMA: Lactic Acid, Venous: 1.4 mmol/L (ref 0.5–1.9)

## 2019-06-11 NOTE — ED Triage Notes (Signed)
Patient ambulatory to triage with steady gait, without difficulty or distress noted, mask in place; pt reports "hair bump" to rt arm wk ago, he squeezed it and has noted swelling and pain has increased to are just below elbow

## 2019-06-12 ENCOUNTER — Emergency Department: Payer: Self-pay

## 2019-06-12 ENCOUNTER — Inpatient Hospital Stay
Admission: EM | Admit: 2019-06-12 | Discharge: 2019-06-14 | DRG: 603 | Disposition: A | Discharge status: Home / Self Care | Payer: Self-pay | Attending: Internal Medicine | Admitting: Internal Medicine

## 2019-06-12 ENCOUNTER — Emergency Department
Admission: EM | Admit: 2019-06-12 | Discharge: 2019-06-12 | Disposition: A | Discharge status: Home / Self Care | Payer: Self-pay | Attending: Emergency Medicine | Admitting: Emergency Medicine

## 2019-06-12 ENCOUNTER — Encounter: Payer: Self-pay | Admitting: Emergency Medicine

## 2019-06-12 ENCOUNTER — Other Ambulatory Visit: Payer: Self-pay

## 2019-06-12 DIAGNOSIS — F17210 Nicotine dependence, cigarettes, uncomplicated: Secondary | ICD-10-CM | POA: Diagnosis present

## 2019-06-12 DIAGNOSIS — Z720 Tobacco use: Secondary | ICD-10-CM

## 2019-06-12 DIAGNOSIS — L02413 Cutaneous abscess of right upper limb: Principal | ICD-10-CM | POA: Diagnosis present

## 2019-06-12 DIAGNOSIS — B9562 Methicillin resistant Staphylococcus aureus infection as the cause of diseases classified elsewhere: Secondary | ICD-10-CM | POA: Diagnosis present

## 2019-06-12 DIAGNOSIS — R7881 Bacteremia: Secondary | ICD-10-CM | POA: Diagnosis present

## 2019-06-12 DIAGNOSIS — B9561 Methicillin susceptible Staphylococcus aureus infection as the cause of diseases classified elsewhere: Secondary | ICD-10-CM | POA: Diagnosis present

## 2019-06-12 DIAGNOSIS — R609 Edema, unspecified: Secondary | ICD-10-CM

## 2019-06-12 DIAGNOSIS — L0291 Cutaneous abscess, unspecified: Secondary | ICD-10-CM

## 2019-06-12 DIAGNOSIS — Z20822 Contact with and (suspected) exposure to covid-19: Secondary | ICD-10-CM | POA: Diagnosis present

## 2019-06-12 DIAGNOSIS — R030 Elevated blood-pressure reading, without diagnosis of hypertension: Secondary | ICD-10-CM | POA: Diagnosis present

## 2019-06-12 DIAGNOSIS — L03113 Cellulitis of right upper limb: Secondary | ICD-10-CM | POA: Diagnosis present

## 2019-06-12 LAB — COMPREHENSIVE METABOLIC PANEL
ALT: 11 U/L (ref 0–44)
AST: 18 U/L (ref 15–41)
Albumin: 4.4 g/dL (ref 3.5–5.0)
Alkaline Phosphatase: 66 U/L (ref 38–126)
Anion gap: 10 (ref 5–15)
BUN: 9 mg/dL (ref 6–20)
CO2: 27 mmol/L (ref 22–32)
Calcium: 9.4 mg/dL (ref 8.9–10.3)
Chloride: 99 mmol/L (ref 98–111)
Creatinine, Ser: 1.04 mg/dL (ref 0.61–1.24)
GFR calc Af Amer: >60 mL/min (ref >60)
GFR calc non Af Amer: >60 mL/min (ref >60)
Glucose, Bld: 102 mg/dL — ABNORMAL HIGH (ref 70–99)
Potassium: 3.7 mmol/L (ref 3.5–5.1)
Sodium: 136 mmol/L (ref 135–145)
Total Bilirubin: 0.8 mg/dL (ref 0.3–1.2)
Total Protein: 7.8 g/dL (ref 6.5–8.1)

## 2019-06-12 LAB — CBC WITH DIFFERENTIAL/PLATELET
Abs Immature Granulocytes: 0.03 10*3/uL (ref 0.00–0.07)
Basophils Absolute: 0 10*3/uL (ref 0.0–0.1)
Basophils Relative: 0 %
Eosinophils Absolute: 0 10*3/uL (ref 0.0–0.5)
Eosinophils Relative: 0 %
HCT: 40.7 % (ref 39.0–52.0)
Hemoglobin: 13.8 g/dL (ref 13.0–17.0)
Immature Granulocytes: 0 %
Lymphocytes Relative: 16 %
Lymphs Abs: 1.4 10*3/uL (ref 0.7–4.0)
MCH: 30.5 pg (ref 26.0–34.0)
MCHC: 33.9 g/dL (ref 30.0–36.0)
MCV: 90 fL (ref 80.0–100.0)
Monocytes Absolute: 0.8 10*3/uL (ref 0.1–1.0)
Monocytes Relative: 9 %
Neutro Abs: 6.6 10*3/uL (ref 1.7–7.7)
Neutrophils Relative %: 75 %
Platelets: 260 10*3/uL (ref 150–400)
RBC: 4.52 MIL/uL (ref 4.22–5.81)
RDW: 12.3 % (ref 11.5–15.5)
WBC: 8.8 10*3/uL (ref 4.0–10.5)
nRBC: 0 % (ref 0.0–0.2)

## 2019-06-12 LAB — PROTIME-INR
INR: 1.1 (ref 0.8–1.2)
Prothrombin Time: 14 seconds (ref 11.4–15.2)

## 2019-06-12 LAB — SARS CORONAVIRUS 2 BY RT PCR (HOSPITAL ORDER, PERFORMED IN ~~LOC~~ HOSPITAL LAB): SARS Coronavirus 2: NEGATIVE

## 2019-06-12 LAB — LACTIC ACID, PLASMA: Lactic Acid, Venous: 0.9 mmol/L (ref 0.5–1.9)

## 2019-06-12 MED ORDER — OXYCODONE-ACETAMINOPHEN 5-325 MG PO TABS
2.0000 | ORAL_TABLET | Freq: Once | ORAL | Status: AC
  Administered 2019-06-12: 2 via ORAL
  Filled 2019-06-12: qty 2

## 2019-06-12 MED ORDER — VANCOMYCIN HCL IN DEXTROSE 1-5 GM/200ML-% IV SOLN: 1000.0000 mg | Freq: Once | INTRAVENOUS | Status: DC

## 2019-06-12 MED ORDER — VANCOMYCIN HCL IN DEXTROSE 1-5 GM/200ML-% IV SOLN
1000.0000 mg | Freq: Once | INTRAVENOUS | Status: AC
  Administered 2019-06-12: 1000 mg via INTRAVENOUS
  Filled 2019-06-12: qty 200

## 2019-06-12 MED ORDER — OXYCODONE-ACETAMINOPHEN 5-325 MG PO TABS: 1.0000 | ORAL_TABLET | ORAL | 0 refills | Status: DC | PRN

## 2019-06-12 MED ORDER — SODIUM CHLORIDE 0.9 % IV SOLN: INTRAVENOUS | Status: DC

## 2019-06-12 MED ORDER — SULFAMETHOXAZOLE-TRIMETHOPRIM 800-160 MG PO TABS: 1.0000 | ORAL_TABLET | Freq: Two times a day (BID) | ORAL | 0 refills | Status: DC

## 2019-06-12 MED ORDER — ONDANSETRON HCL 4 MG PO TABS: 4.0000 mg | ORAL_TABLET | Freq: Four times a day (QID) | ORAL | Status: DC | PRN

## 2019-06-12 MED ORDER — TRAZODONE HCL 50 MG PO TABS: 25.0000 mg | ORAL_TABLET | Freq: Every evening | ORAL | Status: DC | PRN

## 2019-06-12 MED ORDER — MAGNESIUM HYDROXIDE 400 MG/5ML PO SUSP: 30.0000 mL | Freq: Every day | ORAL | Status: DC | PRN

## 2019-06-12 MED ORDER — OXYCODONE-ACETAMINOPHEN 5-325 MG PO TABS
1.0000 | ORAL_TABLET | ORAL | Status: DC | PRN
  Administered 2019-06-13: 1 via ORAL
  Filled 2019-06-12 (×2): qty 1

## 2019-06-12 MED ORDER — ONDANSETRON HCL 4 MG/2ML IJ SOLN: 4.0000 mg | Freq: Four times a day (QID) | INTRAMUSCULAR | Status: DC | PRN

## 2019-06-12 MED ORDER — ENOXAPARIN SODIUM 40 MG/0.4ML ~~LOC~~ SOLN: 40.0000 mg | SUBCUTANEOUS | Status: DC

## 2019-06-12 MED ORDER — CEPHALEXIN 500 MG PO CAPS: 500.0000 mg | ORAL_CAPSULE | Freq: Two times a day (BID) | ORAL | 0 refills | Status: DC

## 2019-06-12 MED ORDER — SODIUM CHLORIDE 0.9 % IV SOLN
1.0000 g | Freq: Once | INTRAVENOUS | Status: AC
  Administered 2019-06-12: 1 g via INTRAVENOUS

## 2019-06-12 MED ORDER — VANCOMYCIN HCL 750 MG/150ML IV SOLN
750.0000 mg | Freq: Three times a day (TID) | INTRAVENOUS | Status: DC
  Administered 2019-06-13 – 2019-06-14 (×4): 750 mg via INTRAVENOUS
  Filled 2019-06-12 (×6): qty 150

## 2019-06-12 MED ORDER — MORPHINE SULFATE (PF) 2 MG/ML IV SOLN
2.0000 mg | Freq: Once | INTRAVENOUS | Status: AC
  Administered 2019-06-12: 2 mg via INTRAVENOUS
  Filled 2019-06-12: qty 1

## 2019-06-12 MED ORDER — VANCOMYCIN HCL IN DEXTROSE 1-5 GM/200ML-% IV SOLN
1000.0000 mg | Freq: Once | INTRAVENOUS | Status: DC
Start: 2019-06-13 — End: 2019-06-12

## 2019-06-12 MED ORDER — BACITRACIN ZINC 500 UNIT/GM EX OINT
TOPICAL_OINTMENT | Freq: Two times a day (BID) | CUTANEOUS | Status: DC
  Administered 2019-06-12: 1 via TOPICAL
  Filled 2019-06-12: qty 0.9

## 2019-06-12 MED ORDER — ACETAMINOPHEN 650 MG RE SUPP: 650.0000 mg | Freq: Four times a day (QID) | RECTAL | Status: DC | PRN

## 2019-06-12 MED ORDER — ACETAMINOPHEN 325 MG PO TABS: 650.0000 mg | ORAL_TABLET | Freq: Four times a day (QID) | ORAL | Status: DC | PRN

## 2019-06-12 NOTE — ED Provider Notes (Signed)
[facility] Emergency Department Provider Note   ____________________________________________   I have reviewed the triage vital signs and the nursing notes.   HISTORY  Chief Complaint Abnormal Lab   History limited by: Not Limited   HPI Ronald Baker is a 34 y.o. male who presents to the emergency department today after blood cultures drawn last night came back positive for MRSA.  The patient was seen in the emergency department for abscess to his right elbow area.  Per documentation this was I&D.  Blood cultures and wound cultures were sent.  After blood cultures came back positive for MRSA patient was called and instructed to come back to the emergency department.  He states he has had continued swelling to his right arm.  He has not noticed any fevers nausea or vomiting.  Records reviewed. Per medical record review patient has a history of ER visit for abscess and I and D last night.   History reviewed. No pertinent past medical history.  There are no problems to display for this patient.   Past Surgical History:  Procedure Laterality Date  . ROOT CANAL      Prior to Admission medications   Medication Sig Start Date End Date Taking? Authorizing Provider  cephALEXin (KEFLEX) 500 MG capsule Take 1 capsule (500 mg total) by mouth 2 (two) times daily for 10 days. 06/12/19 06/22/19  Darci Current, MD  clindamycin (CLEOCIN) 300 MG capsule Take 1 capsule (300 mg total) by mouth 3 (three) times daily. 12/06/15   Hagler, Jami L, PA-C  naproxen (NAPROSYN) 500 MG tablet Take 1 tablet (500 mg total) by mouth 2 (two) times daily with a meal. 12/02/15   Joni Reining, PA-C  oxyCODONE-acetaminophen (PERCOCET) 5-325 MG tablet Take 1 tablet by mouth every 4 (four) hours as needed. 06/12/19   Darci Current, MD  sulfamethoxazole-trimethoprim (BACTRIM DS) 800-160 MG tablet Take 1 tablet by mouth 2 (two) times daily. 03/08/19   Menshew, Charlesetta Ivory, PA-C   sulfamethoxazole-trimethoprim (BACTRIM DS) 800-160 MG tablet Take 1 tablet by mouth 2 (two) times daily for 10 days. 06/12/19 06/22/19  Darci Current, MD  traMADol (ULTRAM) 50 MG tablet Take 1 tablet (50 mg total) by mouth every 6 (six) hours as needed. 12/06/15   Hagler, Jami L, PA-C    Allergies Patient has no known allergies.  Family History  Problem Relation Age of Onset  . Cancer Mother     Social History Social History   Tobacco Use  . Smoking status: Current Every Day Smoker    Packs/day: 1.00    Years: 10.00    Pack years: 10.00    Types: Cigarettes  . Smokeless tobacco: Never Used  Substance Use Topics  . Alcohol use: Yes    Alcohol/week: 4.0 standard drinks    Types: 2 Cans of beer, 2 Shots of liquor per week  . Drug use: No    Review of Systems Constitutional: No fever/chills Eyes: No visual changes. ENT: No sore throat. Cardiovascular: Denies chest pain. Respiratory: Denies shortness of breath. Gastrointestinal: No abdominal pain.  No nausea, no vomiting.  No diarrhea.   Genitourinary: Negative for dysuria. Musculoskeletal: Positive for right arm swelling and pain. Skin: Negative for rash. Neurological: Negative for headaches, focal weakness or numbness.  ____________________________________________   PHYSICAL EXAM:  VITAL SIGNS: ED Triage Vitals [06/12/19 1624]  Enc Vitals Group     BP (!) 141/80     Pulse Rate (!) 104  Resp 18     Temp 100.2 F (37.9 C)     Temp Source Oral     SpO2 100 %     Weight 160 lb 0.9 oz (72.6 kg)     Height 5\' 9"  (1.753 m)     Head Circumference      Peak Flow      Pain Score 8    Constitutional: Alert and oriented.  Eyes: Conjunctivae are normal.  ENT      Head: Normocephalic and atraumatic.      Nose: No congestion/rhinnorhea.      Mouth/Throat: Mucous membranes are moist.      Neck: No stridor. Hematological/Lymphatic/Immunilogical: No cervical lymphadenopathy. Cardiovascular: Normal rate, regular  rhythm.  No murmurs, rubs, or gallops.  Respiratory: Normal respiratory effort without tachypnea nor retractions. Breath sounds are clear and equal bilaterally. No wheezes/rales/rhonchi. Gastrointestinal: Soft and non tender. No rebound. No guarding.  Genitourinary: Deferred Musculoskeletal: Right forearm with swelling and warmth. I and D site actively draining pus like material. Neurologic:  Normal speech and language. No gross focal neurologic deficits are appreciated.  Skin:  Skin is warm, dry and intact. No rash noted. Psychiatric: Mood and affect are normal. Speech and behavior are normal. Patient exhibits appropriate insight and judgment.  ____________________________________________    LABS (pertinent positives/negatives)  Lactic 0.9 CBC wbc 8.8, hgb 13.8, plt 260 CMP wnl except glu 102  ____________________________________________   EKG  None  ____________________________________________    RADIOLOGY  None  ____________________________________________   PROCEDURES  Procedures  ____________________________________________   INITIAL IMPRESSION / ASSESSMENT AND PLAN / ED COURSE  Pertinent labs & imaging results that were available during my care of the patient were reviewed by me and considered in my medical decision making (see chart for details).   Patient presented to the emergency department today because of concerns for positive blood culture sent last night.  They were positive for MRSA.  Patient still has some swelling about his right forearm although the wound is still draining.  Will start IV vancomycin.  Discussed with patient portance of admission.  ____________________________________________   FINAL CLINICAL IMPRESSION(S) / ED DIAGNOSES  Final diagnoses:  Bacteremia  Abscess     Note: This dictation was prepared with Dragon dictation. Any transcriptional errors that result from this process are unintentional     Nance Pear,  MD 06/12/19 2016

## 2019-06-12 NOTE — ED Provider Notes (Signed)
-----------------------------------------   11:32 AM on 06/12/2019 -----------------------------------------  We received a positive blood culture result from microbiology.  The patient has MRSA in 1 blood culture.  I contacted the patient by phone.  He denies any fever or chills and states he overall feels well, but does state that the pain and swelling in the arm is worse.  I advised him about the positive blood culture and inform them that this indicates he could be having a bloodstream infection which can be life-threatening.  I advised that he needs to be reevaluated in the ED as soon as possible and may need further IV antibiotics and possible admission.  The patient is at work right now and states he will come back as soon as he is able to.  He initially stated that he would try to come back today, but may not be able to come until tomorrow.  I strongly encouraged him to come in soon as possible and not to wait until tomorrow.  He expressed agreement.   Dionne Bucy, MD 06/12/19 1134

## 2019-06-12 NOTE — Progress Notes (Signed)
Pharmacy Antibiotic Note  Ronald Baker is a 34 y.o. male admitted on 06/12/2019 with cellulitis and MRSA bacteremia.  Pharmacy has been consulted for Vancomycin dosing.  Plan: Vancomycin 750 IV every 8 hours.  Goal trough 15-20 mcg/mL.  Height: 5\' 9"  (175.3 cm) Weight: 72.6 kg (160 lb 0.9 oz) IBW/kg (Calculated) : 70.7  Temp (24hrs), Avg:99.2 F (37.3 C), Min:98.1 F (36.7 C), Max:100.2 F (37.9 C)  Recent Labs  Lab 06/11/19 1947 06/12/19 1639 06/12/19 1642  WBC 6.6 8.8  --   CREATININE 1.12 1.04  --   LATICACIDVEN 1.4  --  0.9    Estimated Creatinine Clearance: 102 mL/min (by C-G formula based on SCr of 1.04 mg/dL).    No Known Allergies  Antimicrobials this admission: Vancomycin 6/4 >>   Dose adjustments this admission:  Microbiology results: 6/3 BCx: GPC 4/4 bottles (BCID - MRSA)  Thank you for allowing pharmacy to be a part of this patient's care.  8/3, PharmD, BCPS 06/12/2019 9:34 PM

## 2019-06-12 NOTE — H&P (Signed)
at [facility]   PATIENT NAME: Ronald Baker    MR#:  [id number]  DATE OF BIRTH:  09/30/1985  DATE OF ADMISSION:  06/12/2019  PRIMARY CARE PHYSICIAN: Patient, No Pcp Per   REQUESTING/REFERRING PHYSICIAN: Phineas Semen, MD  CHIEF COMPLAINT:   Chief Complaint  Patient presents with  . Abnormal Lab    HISTORY OF PRESENT ILLNESS:  Ronald Baker  is a 34 y.o. male with a known history of ongoing tobacco abuse, who was seen in the emergency room for right posterior elbow abscess last night and underwent incision and drainage.  He was discharged on p.o. Keflex and Bactrim DS.  He had a blood culture that grew MRSA.  The patient denied any fever or chills at home however his temperature here was up to 100.2 earlier this morning.  He denied any chest pain or dyspnea or cough or wheezing.  No nausea or vomiting or abdominal pain.  No rhinorrhea or nasal congestion or sore throat or earache.  He continues to have right elbow swelling and tenderness surrounding his drained abscess.  Upon presentation to the emergency room, blood pressure was 156/79 and otherwise vital signs were within normal.  Labs revealed unremarkable CMP.  Lactic acid was 0.9.  CBC was within normal.  Blood cultures were drawn.  COVID-19 PCR came back negative.  The patient was given IV vancomycin.  He will be admitted to a medical monitored bed for further evaluation and management.  PAST MEDICAL HISTORY:  -Ongoing tobacco abuse -Right posterior elbow cellulitis and abscess post I&D  PAST SURGICAL HISTORY:   Past Surgical History:  Procedure Laterality Date  . ROOT CANAL    -I&D of right elbow abscess  SOCIAL HISTORY:   Social History   Tobacco Use  . Smoking status: Current Every Day Smoker    Packs/day: 1.00    Years: 10.00    Pack years: 10.00    Types: Cigarettes  . Smokeless tobacco: Never Used  Substance Use Topics  . Alcohol use: Yes    Alcohol/week: 4.0 standard drinks     Types: 2 Cans of beer, 2 Shots of liquor per week  No history of IV drug abuse.  FAMILY HISTORY:   Family History  Problem Relation Age of Onset  . Cancer Mother     DRUG ALLERGIES:  No Known Allergies  REVIEW OF SYSTEMS:   ROS As per history of present illness. All pertinent systems were reviewed above. Constitutional,  HEENT, cardiovascular, respiratory, GI, GU, musculoskeletal, neuro, psychiatric, endocrine,  integumentary and hematologic systems were reviewed and are otherwise  negative/unremarkable except for positive findings mentioned above in the HPI.   MEDICATIONS AT HOME:   Prior to Admission medications   Medication Sig Start Date End Date Taking? Authorizing Provider  cephALEXin (KEFLEX) 500 MG capsule Take 1 capsule (500 mg total) by mouth 2 (two) times daily for 10 days. 06/12/19 06/22/19  Darci Current, MD  clindamycin (CLEOCIN) 300 MG capsule Take 1 capsule (300 mg total) by mouth 3 (three) times daily. 12/06/15   Hagler, Jami L, PA-C  naproxen (NAPROSYN) 500 MG tablet Take 1 tablet (500 mg total) by mouth 2 (two) times daily with a meal. 12/02/15   Joni Reining, PA-C  oxyCODONE-acetaminophen (PERCOCET) 5-325 MG tablet Take 1 tablet by mouth every 4 (four) hours as needed. 06/12/19   Darci Current, MD  sulfamethoxazole-trimethoprim (BACTRIM DS) 800-160 MG tablet Take 1 tablet by mouth 2 (two) times  daily. 03/08/19   Menshew, Charlesetta Ivory, PA-C  sulfamethoxazole-trimethoprim (BACTRIM DS) 800-160 MG tablet Take 1 tablet by mouth 2 (two) times daily for 10 days. 06/12/19 06/22/19  Darci Current, MD  traMADol (ULTRAM) 50 MG tablet Take 1 tablet (50 mg total) by mouth every 6 (six) hours as needed. 12/06/15   Hagler, Jami L, PA-C      VITAL SIGNS:  Blood pressure (!) 141/80, pulse (!) 104, temperature 100.2 F (37.9 C), temperature source Oral, resp. rate 18, height 5\' 9"  (1.753 m), weight 72.6 kg, SpO2 100 %.  PHYSICAL EXAMINATION:  Physical  Exam  GENERAL:  34 y.o.-year-old patient lying in the bed with no acute distress.  EYES: Pupils equal, round, reactive to light and accommodation. No scleral icterus. Extraocular muscles intact.  HEENT: Head atraumatic, normocephalic. Oropharynx and nasopharynx clear.  NECK:  Supple, no jugular venous distention. No thyroid enlargement, no tenderness.  LUNGS: Normal breath sounds bilaterally, no wheezing, rales,rhonchi or crepitation. No use of accessory muscles of respiration.  CARDIOVASCULAR: Regular rate and rhythm, S1, S2 normal. No murmurs, rubs, or gallops.  ABDOMEN: Soft, nondistended, nontender. Bowel sounds present. No organomegaly or mass.  EXTREMITIES: No pedal edema, cyanosis, or clubbing.  NEUROLOGIC: Cranial nerves II through XII are intact. Muscle strength 5/5 in all extremities. Sensation intact. Gait not checked.  PSYCHIATRIC: The patient is alert and oriented x 3.  Normal affect and good eye contact. SKIN: Right posterior elbow swelling with associated erythema, mild induration warmth and tenderness surrounding wound site with minimal fluctuation.34    LABORATORY PANEL:   CBC Recent Labs  Lab 06/12/19 1639  WBC 8.8  HGB 13.8  HCT 40.7  PLT 260   ------------------------------------------------------------------------------------------------------------------  Chemistries  Recent Labs  Lab 06/12/19 1639  NA 136  K 3.7  CL 99  CO2 27  GLUCOSE 102*  BUN 9  CREATININE 1.04  CALCIUM 9.4  AST 18  ALT 11  ALKPHOS 66  BILITOT 0.8   ------------------------------------------------------------------------------------------------------------------  Cardiac Enzymes No results for input(s): TROPONINI in the last 168 hours. ------------------------------------------------------------------------------------------------------------------  RADIOLOGY:  08/12/19 RT UPPER EXTREM LTD SOFT TISSUE NON VASCULAR  Result Date: 06/12/2019 CLINICAL DATA:  For 5 days focal area  of swelling and redness EXAM: ULTRASOUND right upper extremity UPPER EXTREMITY LIMITED TECHNIQUE: Ultrasound examination of the upper extremity soft tissues was performed in the area of clinical concern. COMPARISON:  None. FINDINGS: Within the area of interest there is a focal hypoechoic area measuring 2.4 by 0.7 cm with peripheral vascularity. There is diffuse subcutaneous edema and vascularity noted. Small overlying area of ulceration is noted. IMPRESSION: Small focal area of ulceration with a loculated subcutaneous fluid collection measuring 2.4 x 0.7 cm, likely consistent with superficial abscess/phlegmon. Findings suggestive of diffuse cellulitis Electronically Signed   By: 08/12/2019 M.D.   On: 06/12/2019 02:04      IMPRESSION AND PLAN:   1.  MRSA bacteremia likely secondary to right elbow cellulitis and abscess. -The patient will be admitted to the medical monitored bed. -We will continue antibiotic therapy with IV vancomycin. -ID consultation will be obtained.  I notified Dr. 08/12/2019 about the patient. -He will be hydrated with IV normal saline. -We will follow the rest of his blood cultures.  2.  Right posterior elbow cellulitis and abscess status post incision and drainage. -Management as above. -A general surgery consultation will be obtained for follow-up. -Wound care consult to be obtained.  3.  Ongoing tobacco abuse. -I counseled  the patient for smoking cessation and he will receive further counseling here.  4.  Elevated blood pressure. -This could be related to pain.  His blood pressure will be monitored while he is here.  5.  DVT prophylaxis. -Subcutaneous Lovenox.   All the records are reviewed and case discussed with ED provider. The plan of care was discussed in details with the patient (and family). I answered all questions. The patient agreed to proceed with the above mentioned plan. Further management will depend upon hospital course.   CODE STATUS: Full  code  Status is: Inpatient  Remains inpatient appropriate because:Ongoing active pain requiring inpatient pain management, Ongoing diagnostic testing needed not appropriate for outpatient work up, Unsafe d/c plan, IV treatments appropriate due to intensity of illness or inability to take PO and Inpatient level of care appropriate due to severity of illness   Dispo: The patient is from: Home              Anticipated d/c is to: Home              Anticipated d/c date is: 3 days              Patient currently is not medically stable to d/c.   TOTAL TIME TAKING CARE OF THIS PATIENT: 50 minutes.    Christel Mormon M.D on 06/12/2019 at 8:23 PM  [facility]   From 7 PM-7 AM, contact night-coverage [email or web address]  CC: Primary care physician; Patient, No Pcp Per   Note: This dictation was prepared with Dragon dictation along with smaller phrase technology. Any transcriptional errors that result from this process are unintentional.

## 2019-06-12 NOTE — ED Provider Notes (Signed)
[facility] Emergency Department Provider Note  ____________________________________________   First MD Initiated Contact with Patient 06/12/19 [phone]     (approximate)  I have reviewed the triage vital signs and the nursing notes.   HISTORY  Chief Complaint Abscess    HPI Ronald Baker is a 34 y.o. male presents to the emergency department secondary to right arm swelling with purulent drainage from a previous "hair bump" that the patient said he squeezed a week ago.  Patient states that since he squeezed the "hair bump" his arm is progressively become more swollen and more painful with associated redness to the skin.  Patient denies any fever afebrile on presentation.  Patient denies any known history of MRSA.  Patient denies any insect bite        History reviewed. No pertinent past medical history.  There are no problems to display for this patient.   Past Surgical History:  Procedure Laterality Date  . ROOT CANAL      Prior to Admission medications   Medication Sig Start Date End Date Taking? Authorizing Provider  cephALEXin (KEFLEX) 500 MG capsule Take 1 capsule (500 mg total) by mouth 2 (two) times daily for 10 days. 06/12/19 06/22/19  Gregor Hams, MD  clindamycin (CLEOCIN) 300 MG capsule Take 1 capsule (300 mg total) by mouth 3 (three) times daily. 12/06/15   Hagler, Jami L, PA-C  naproxen (NAPROSYN) 500 MG tablet Take 1 tablet (500 mg total) by mouth 2 (two) times daily with a meal. 12/02/15   Sable Feil, PA-C  oxyCODONE-acetaminophen (PERCOCET) 5-325 MG tablet Take 1 tablet by mouth every 4 (four) hours as needed. 06/12/19   Gregor Hams, MD  sulfamethoxazole-trimethoprim (BACTRIM DS) 800-160 MG tablet Take 1 tablet by mouth 2 (two) times daily. 03/08/19   Menshew, Dannielle Karvonen, PA-C  sulfamethoxazole-trimethoprim (BACTRIM DS) 800-160 MG tablet Take 1 tablet by mouth 2 (two) times daily for 10 days. 06/12/19 06/22/19  Gregor Hams, MD  traMADol (ULTRAM) 50 MG tablet Take 1 tablet (50 mg total) by mouth every 6 (six) hours as needed. 12/06/15   Hagler, Jami L, PA-C    Allergies Patient has no known allergies.  Family History  Problem Relation Age of Onset  . Cancer Mother     Social History Social History   Tobacco Use  . Smoking status: Current Every Day Smoker    Packs/day: 1.00    Years: 10.00    Pack years: 10.00    Types: Cigarettes  . Smokeless tobacco: Never Used  Substance Use Topics  . Alcohol use: Yes    Alcohol/week: 4.0 standard drinks    Types: 2 Cans of beer, 2 Shots of liquor per week  . Drug use: No    Review of Systems Constitutional: No fever/chills Eyes: No visual changes. ENT: No sore throat. Cardiovascular: Denies chest pain. Respiratory: Denies shortness of breath. Gastrointestinal: No abdominal pain.  No nausea, no vomiting.  No diarrhea.  No constipation. Genitourinary: Negative for dysuria. Musculoskeletal: Negative for neck pain.  Negative for back pain. Integumentary: Negative for rash.  Positive for swelling of the right forearm Neurological: Negative for headaches, focal weakness or numbness.   ____________________________________________   PHYSICAL EXAM:  VITAL SIGNS: ED Triage Vitals  Enc Vitals Group     BP 06/11/19 1944 (!) 149/95     Pulse Rate 06/11/19 1944 85     Resp 06/11/19 1944 18     Temp 06/11/19 1944  98.1 F (36.7 C)     Temp Source 06/11/19 1944 Oral     SpO2 06/11/19 1944 100 %     Weight 06/11/19 1945 72.6 kg (160 lb)     Height 06/11/19 1945 1.753 m (5\' 9" )     Head Circumference --      Peak Flow --      Pain Score 06/11/19 1945 10     Pain Loc --      Pain Edu? --      Excl. in GC? --     Constitutional: Alert and oriented.  Eyes: Conjunctivae are normal.  Mouth/Throat: Patient is wearing a mask. Neck: No stridor.  No meningeal signs.   Cardiovascular: Normal rate, regular rhythm. Good peripheral circulation. Grossly normal  heart sounds. Respiratory: Normal respiratory effort.  No retractions. Gastrointestinal: Soft and nontender. No distention.  Musculoskeletal: Pustule noted right forearm just inferior to the elbow with associated swelling to the mid forearm. Neurologic:  Normal speech and language. No gross focal neurologic deficits are appreciated.  Skin: Pustule noted right forearm just inferior to the elbow. Psychiatric: Mood and affect are normal. Speech and behavior are normal.  ____________________________________________   LABS (all labs ordered are listed, but only abnormal results are displayed)  Labs Reviewed  CULTURE, BLOOD (ROUTINE X 2)  CULTURE, BLOOD (ROUTINE X 2)  AEROBIC CULTURE (SUPERFICIAL SPECIMEN)  CBC WITH DIFFERENTIAL/PLATELET  COMPREHENSIVE METABOLIC PANEL  LACTIC ACID, PLASMA  LACTIC ACID, PLASMA   ________  RADIOLOGY I, [place] N Krithik Mapel, personally viewed and evaluated these images (plain radiographs) as part of my medical decision making, as well as reviewing the written report by the radiologist.  ED MD interpretation: Small focal area of ulceration with a loculated subcutaneous fluid collection 2.4 x 0.7 cm on right forearm ultrasound  Official radiology report(s): 08/11/19 RT UPPER EXTREM LTD SOFT TISSUE NON VASCULAR  Result Date: 06/12/2019 CLINICAL DATA:  For 5 days focal area of swelling and redness EXAM: ULTRASOUND right upper extremity UPPER EXTREMITY LIMITED TECHNIQUE: Ultrasound examination of the upper extremity soft tissues was performed in the area of clinical concern. COMPARISON:  None. FINDINGS: Within the area of interest there is a focal hypoechoic area measuring 2.4 by 0.7 cm with peripheral vascularity. There is diffuse subcutaneous edema and vascularity noted. Small overlying area of ulceration is noted. IMPRESSION: Small focal area of ulceration with a loculated subcutaneous fluid collection measuring 2.4 x 0.7 cm, likely consistent with superficial  abscess/phlegmon. Findings suggestive of diffuse cellulitis Electronically Signed   By: 08/12/2019 M.D.   On: 06/12/2019 02:04    ____________________________________________   PROCEDURES    .08/04/2021Incision and Drainage  Date/Time: 06/12/2019 6:43 AM Performed by: 08/12/2019, MD Authorized by: Darci Current, MD   Consent:    Consent obtained:  Verbal   Consent given by:  Patient   Risks discussed:  Bleeding, infection, incomplete drainage and pain   Alternatives discussed:  Alternative treatment, delayed treatment and observation Location:    Type:  Abscess   Location:  Upper extremity   Upper extremity location: Forearm. Pre-procedure details:    Skin preparation:  Betadine Anesthesia (see MAR for exact dosages):    Anesthesia method:  Local infiltration   Local anesthetic:  Lidocaine 1% w/o epi Procedure type:    Complexity:  Complex Procedure details:    Incision types:  Cruciate   Incision depth:  Subcutaneous   Scalpel blade:  11   Drainage:  Purulent  Drainage amount:  Moderate   Packing materials:  None Post-procedure details:    Patient tolerance of procedure:  Tolerated well, no immediate complications     ____________________________________________   INITIAL IMPRESSION / MDM / ASSESSMENT AND PLAN / ED COURSE  As part of my medical decision making, I reviewed the following data within the electronic MEDICAL RECORD NUMBER   34 year old male presented with above-stated history and physical exam a differential diagnosis including but not limited to right forearm abscess/cellulitis.  Ultrasounds performed which is consistent with both abscess and cellulitis.  I&D was performed with purulent drainage obtained culture was also obtained to evaluate for MRSA.  Patient given 1 g IV ceftriaxone in the emergency department will be prescribed Bactrim and Keflex for home.  Patient also given 2 mg of IV morphine in the emergency department pain improvement will be  prescribed Percocet for home.  Spoke with the patient at length regarding not driving or operating machinery while taking Percocet.  ____________________________________________  FINAL CLINICAL IMPRESSION(S) / ED DIAGNOSES  Final diagnoses:  Swelling  Abscess  Abscess of right forearm     MEDICATIONS GIVEN DURING THIS VISIT:  Medications  bacitracin ointment (1 application Topical Given 06/12/19 0303)  morphine 2 MG/ML injection 2 mg (2 mg Intravenous Given 06/12/19 0107)  cefTRIAXone (ROCEPHIN) 1 g in sodium chloride 0.9 % 100 mL IVPB (0 g Intravenous Stopped 06/12/19 0231)  oxyCODONE-acetaminophen (PERCOCET/ROXICET) 5-325 MG per tablet 2 tablet (2 tablets Oral Given 06/12/19 0303)     ED Discharge Orders         Ordered    sulfamethoxazole-trimethoprim (BACTRIM DS) 800-160 MG tablet  2 times daily     06/12/19 0308    cephALEXin (KEFLEX) 500 MG capsule  2 times daily     06/12/19 0308    oxyCODONE-acetaminophen (PERCOCET) 5-325 MG tablet  Every 4 hours PRN     06/12/19 0308          *Please note:  Ronald Baker was evaluated in Emergency Department on 06/12/2019 for the symptoms described in the history of present illness. He was evaluated in the context of the global COVID-19 pandemic, which necessitated consideration that the patient might be at risk for infection with the SARS-CoV-2 virus that causes COVID-19. Institutional protocols and algorithms that pertain to the evaluation of patients at risk for COVID-19 are in a state of rapid change based on information released by regulatory bodies including the CDC and federal and state organizations. These policies and algorithms were followed during the patient's care in the ED.  Some ED evaluations and interventions may be delayed as a result of limited staffing during the pandemic.*  Note:  This document was prepared using Dragon voice recognition software and may include unintentional dictation errors.   Darci Current,  MD 06/12/19 [phone]

## 2019-06-12 NOTE — Progress Notes (Signed)
ID Had a vigilanz alert for MRSA bacteremia. Was in the ED yesterday for an infection rt arm- He was given ceftrixone, and sent home on keflex and bactrim blood culture positive for MRSA I called the patient and he is working now [employer] [address] in [place]- will come to the ED in the evening- his arm is more swollen as he has not filled the prescription he says!

## 2019-06-12 NOTE — ED Triage Notes (Signed)
Pt to ER states was seen yesterday for abscess to right elbow.  PT was called today with report of positive blood cx and told to come back for IV abx and reevaluation.

## 2019-06-13 ENCOUNTER — Encounter: Payer: Self-pay | Admitting: Internal Medicine

## 2019-06-13 ENCOUNTER — Inpatient Hospital Stay
Admit: 2019-06-13 | Discharge: 2019-06-13 | Disposition: A | Discharge status: Home / Self Care | Payer: Self-pay | Attending: Internal Medicine | Admitting: Internal Medicine

## 2019-06-13 DIAGNOSIS — L0291 Cutaneous abscess, unspecified: Secondary | ICD-10-CM

## 2019-06-13 LAB — URINALYSIS, COMPLETE (UACMP) WITH MICROSCOPIC
Bacteria, UA: NONE SEEN
Bilirubin Urine: NEGATIVE
Glucose, UA: NEGATIVE mg/dL
Hgb urine dipstick: NEGATIVE
Ketones, ur: NEGATIVE mg/dL
Leukocytes,Ua: NEGATIVE
Nitrite: NEGATIVE
Protein, ur: NEGATIVE mg/dL
Specific Gravity, Urine: 1.014 (ref 1.005–1.030)
Squamous Epithelial / HPF: NONE SEEN (ref 0–5)
pH: 6 (ref 5.0–8.0)

## 2019-06-13 LAB — BASIC METABOLIC PANEL
Anion gap: 7 (ref 5–15)
BUN: 10 mg/dL (ref 6–20)
CO2: 27 mmol/L (ref 22–32)
Calcium: 9 mg/dL (ref 8.9–10.3)
Chloride: 102 mmol/L (ref 98–111)
Creatinine, Ser: 1.09 mg/dL (ref 0.61–1.24)
GFR calc Af Amer: >60 mL/min (ref >60)
GFR calc non Af Amer: >60 mL/min (ref >60)
Glucose, Bld: 101 mg/dL — ABNORMAL HIGH (ref 70–99)
Potassium: 3.8 mmol/L (ref 3.5–5.1)
Sodium: 136 mmol/L (ref 135–145)

## 2019-06-13 LAB — CORTISOL-AM, BLOOD: Cortisol - AM: 7 ug/dL (ref 6.7–22.6)

## 2019-06-13 LAB — BLOOD CULTURE ID PANEL (REFLEXED)

## 2019-06-13 LAB — PROTIME-INR
INR: 1.1 (ref 0.8–1.2)
Prothrombin Time: 14.1 seconds (ref 11.4–15.2)

## 2019-06-13 LAB — ECHOCARDIOGRAM COMPLETE
Height: 69 in
Weight: 2560.86 oz

## 2019-06-13 LAB — PROCALCITONIN: Procalcitonin: <0.1 ng/mL

## 2019-06-13 LAB — LACTIC ACID, PLASMA: Lactic Acid, Venous: 0.7 mmol/L (ref 0.5–1.9)

## 2019-06-13 LAB — CBC
HCT: 38.7 % — ABNORMAL LOW (ref 39.0–52.0)
Hemoglobin: 13.3 g/dL (ref 13.0–17.0)
MCH: 30 pg (ref 26.0–34.0)
MCHC: 34.4 g/dL (ref 30.0–36.0)
MCV: 87.2 fL (ref 80.0–100.0)
Platelets: 242 10*3/uL (ref 150–400)
RBC: 4.44 MIL/uL (ref 4.22–5.81)
RDW: 12.4 % (ref 11.5–15.5)
WBC: 8.3 10*3/uL (ref 4.0–10.5)
nRBC: 0 % (ref 0.0–0.2)

## 2019-06-13 LAB — HIV ANTIBODY (ROUTINE TESTING W REFLEX): HIV Screen 4th Generation wRfx: NONREACTIVE

## 2019-06-13 MED ORDER — ENOXAPARIN SODIUM 40 MG/0.4ML ~~LOC~~ SOLN
40.0000 mg | SUBCUTANEOUS | Status: DC
  Administered 2019-06-13 – 2019-06-14 (×2): 40 mg via SUBCUTANEOUS
  Filled 2019-06-13 (×2): qty 0.4

## 2019-06-13 MED ORDER — SODIUM CHLORIDE 0.9 % IV SOLN
INTRAVENOUS | Status: DC | PRN
  Administered 2019-06-13: 250 mL via INTRAVENOUS

## 2019-06-13 MED ORDER — OXYCODONE-ACETAMINOPHEN 5-325 MG PO TABS
1.0000 | ORAL_TABLET | ORAL | Status: DC | PRN
  Administered 2019-06-13: 1 via ORAL
  Administered 2019-06-13: 2 via ORAL
  Filled 2019-06-13: qty 2

## 2019-06-13 MED ORDER — IBUPROFEN 400 MG PO TABS
400.0000 mg | ORAL_TABLET | Freq: Four times a day (QID) | ORAL | Status: DC
  Administered 2019-06-13 – 2019-06-14 (×4): 400 mg via ORAL
  Filled 2019-06-13 (×4): qty 1

## 2019-06-13 NOTE — Progress Notes (Signed)
[facility] NAME: Ronald Baker    MR#:  [id number]  DATE OF BIRTH:  [birth year]  SUBJECTIVE:   Patient was asked to come back to the emergency room given his positive blood cultures for  M RSA. Right elbow swelling with pain. Had abscess drained in the emergency room. Seen by surgery. Denies any other complaints but pain on the right elbow. REVIEW OF SYSTEMS:   Review of Systems  Constitutional: Negative for chills, fever and weight loss.  HENT: Negative for ear discharge, ear pain and nosebleeds.   Eyes: Negative for blurred vision, pain and discharge.  Respiratory: Negative for sputum production, shortness of breath, wheezing and stridor.   Cardiovascular: Negative for chest pain, palpitations, orthopnea and PND.  Gastrointestinal: Negative for abdominal pain, diarrhea, nausea and vomiting.  Genitourinary: Negative for frequency and urgency.  Musculoskeletal: Positive for joint pain. Negative for back pain.  Neurological: Negative for sensory change, speech change, focal weakness and weakness.  Psychiatric/Behavioral: Negative for depression and hallucinations. The patient is not nervous/anxious.    Tolerating Diet:yes Tolerating PT:   DRUG ALLERGIES:  No Known Allergies  VITALS:  Blood pressure 139/89, pulse (!) 59, temperature 98.1 F (36.7 C), temperature source Oral, resp. rate 18, height 5\' 9"  (1.753 m), weight 72.6 kg, SpO2 100 %.  PHYSICAL EXAMINATION:   Physical Exam  GENERAL:  34 y.o.-year-old patient lying in the bed with no acute distress.  EYES: Pupils equal, round, reactive to light and accommodation. No scleral icterus.   HEENT: Head atraumatic, normocephalic. Oropharynx and nasopharynx clear.  NECK:  Supple, no jugular venous distention. No thyroid enlargement, no tenderness.  LUNGS: Normal breath sounds bilaterally, no wheezing, rales, rhonchi. No use of accessory muscles of respiration.  CARDIOVASCULAR:  S1, S2 normal. No murmurs, rubs, or gallops.  ABDOMEN: Soft, nontender, nondistended. Bowel sounds present. No organomegaly or mass.  EXTREMITIES:       NEUROLOGIC: Cranial nerves II through XII are intact. No focal Motor or sensory deficits b/l.   PSYCHIATRIC:  patient is alert and oriented x 3.  SKIN: No obvious rash, lesion, or ulcer.   LABORATORY PANEL:  CBC Recent Labs  Lab 06/13/19 0211  WBC 8.3  HGB 13.3  HCT 38.7*  PLT 242    Chemistries  Recent Labs  Lab 06/12/19 1639 06/12/19 1639 06/13/19 0211  NA 136   < > 136  K 3.7   < > 3.8  CL 99   < > 102  CO2 27   < > 27  GLUCOSE 102*   < > 101*  BUN 9   < > 10  CREATININE 1.04   < > 1.09  CALCIUM 9.4   < > 9.0  AST 18  --   --   ALT 11  --   --   ALKPHOS 66  --   --   BILITOT 0.8  --   --    < > = values in this interval not displayed.   Cardiac Enzymes No results for input(s): TROPONINI in the last 168 hours. RADIOLOGY:  [place] RT UPPER EXTREM LTD SOFT TISSUE NON VASCULAR  Result Date: 06/12/2019 CLINICAL DATA:  For 5 days focal area of swelling and redness EXAM: ULTRASOUND right upper extremity UPPER EXTREMITY LIMITED TECHNIQUE: Ultrasound examination of the upper extremity soft tissues was performed in the area of clinical concern. COMPARISON:  None. FINDINGS: Within the area of interest there is a focal  hypoechoic area measuring 2.4 by 0.7 cm with peripheral vascularity. There is diffuse subcutaneous edema and vascularity noted. Small overlying area of ulceration is noted. IMPRESSION: Small focal area of ulceration with a loculated subcutaneous fluid collection measuring 2.4 x 0.7 cm, likely consistent with superficial abscess/phlegmon. Findings suggestive of diffuse cellulitis Electronically Signed   By: Jonna Clark M.D.   On: 06/12/2019 02:04   ASSESSMENT AND PLAN:   Ronald Baker  is a 34 y.o. male with a known history of ongoing tobacco abuse, who was seen in the emergency room for right posterior elbow abscess  last night and underwent incision and drainage.  He was discharged on p.o. Keflex and Bactrim DS.  He had a blood culture that grew MRSA.  The patient denied any fever or chills at home however his temperature here was up to 100.2.  1.  MRSA bacteremia likely secondary to right elbow cellulitis and abscess. -We will continue antibiotic therapy with IV vancomycin. -ID consultation with Dr. Rivka Safer --monitor fever curve -echo of the heart   2.  Right posterior elbow cellulitis and abscess status post incision and drainage. -Management as above. -- Gen. surgery consultation with Dr. Tonna Boehringer appreciated. Right forearm abscess was evaluated and packing done.  3.  Ongoing tobacco abuse. - counseled the patient for smoking cessation and he will receive further counseling here.  4.  Elevated blood pressure. -This could be related to pain.  His blood pressure will be monitored  5.  DVT prophylaxis. -Subcutaneous Lovenox.   CODE STATUS: Full code  Status is: Inpatient  Remains inpatient appropriate because of IV abxs needed for MRSA sepsis  Dispo: The patient is from: Home  Anticipated d/c is to: Home  Anticipated d/c date is: 3 days  Patient currently is not medically stable to d/c. Status is: Inpatient Getting IV vancomycin for MRSA sepsis  Procedures:I and D with packing Family communication :none Consults :Surgery CODE STATUS: FULL DVT Prophylaxis :lovenox   TOTAL TIME TAKING CARE OF THIS PATIENT: *35* minutes.  >50% time spent on counselling and coordination of care  Note: This dictation was prepared with Dragon dictation along with smaller phrase technology. Any transcriptional errors that result from this process are unintentional.  Enedina Finner M.D    [facility]   CC: Primary care physician; Patient, No Pcp PerPatient ID: Ronald Baker, male   DOB: 1985/07/12, 34 y.o.   MRN: [id number]

## 2019-06-13 NOTE — Consult Note (Signed)
Subjective:   CC: Right arm abscess  HPI:  Ronald Baker is a 34 y.o. male who was consulted by [facility] for evaluation of above.  First noted a few days ago.  Symptoms include: Pain is sharp, expanding along with the increased swelling.  Exacerbated by touch.  Alleviated by nothing specific.  Associated with area on forearm that looks like a pimple.  He is status post I&D of the area of concern in the emergency department yesterday, sent home on some oral antibiotics.  Blood work that was done during that time showed MRSA bacteremia, so was recommended to return to the hospital for IV antibiotic therapy.  He was noted to have increased swelling in the area as well as drainage from the former I&D site, surgery was consulted for further management.  Since he has returned to the hospital and started IV antibiotics, he reports that the swelling in his arm has improved significantly as well as his overall discomfort.   Past Medical History:  has no past medical history on file.  Past Surgical History:  has a past surgical history that includes Root canal.  Family History: family history includes Cancer in his mother.  Social History:  reports that he has been smoking cigarettes. He has a 10.00 pack-year smoking history. He has never used smokeless tobacco. He reports current alcohol use of about 4.0 standard drinks of alcohol per week. He reports that he does not use drugs.  Current Medications: Not filled out recently prescribed oral antibiotics  Allergies:  No Known Allergies  ROS:  General: Denies weight loss, weight gain, fatigue, fevers, chills, and night sweats. Eyes: Denies blurry vision, double vision, eye pain, itchy eyes, and tearing. Ears: Denies hearing loss, earache, and ringing in ears. Nose: Denies sinus pain, congestion, infections, runny nose, and nosebleeds. Mouth/throat: Denies hoarseness, sore throat, bleeding gums, and difficulty swallowing. Heart: Denies chest pain,  palpitations, racing heart, irregular heartbeat, leg pain or swelling, and decreased activity tolerance. Respiratory: Denies breathing difficulty, shortness of breath, wheezing, cough, and sputum. GI: Denies change in appetite, heartburn, nausea, vomiting, constipation, diarrhea, and blood in stool. GU: Denies difficulty urinating, pain with urinating, urgency, frequency, blood in urine. Musculoskeletal: Denies joint stiffness, pain, swelling, muscle weakness. Skin: Denies rash, itching, mass, tumors, sores, and boils Neurologic: Denies headache, fainting, dizziness, seizures, numbness, and tingling. Psychiatric: Denies depression, anxiety, difficulty sleeping, and memory loss. Endocrine: Denies heat or cold intolerance, and increased thirst or urination. Blood/lymph: Denies easy bruising, easy bruising, and swollen glands     Objective:     BP 101/86   Pulse 65   Temp 100.2 F (37.9 C) (Oral)   Resp 16   Ht 5\' 9"  (1.753 m)   Wt 72.6 kg   SpO2 100%   BMI 23.64 kg/m   Constitutional :  alert, cooperative, appears stated age and no distress  Lymphatics/Throat:  no asymmetry, masses, or scars  Respiratory:  clear to auscultation bilaterally  Cardiovascular:  regular rate and rhythm  Gastrointestinal: soft, non-tender; bowel sounds normal; no masses,  no organomegaly.  Musculoskeletal: Steady gait and movement  Skin: Cool and moist.  Right arm cellulitis extending through entire right forearm, but extension beyond the elbow and into wrist.  Full range of motion noted with minimal tenderness to palpation in this area.  Area of open abscess noted at the olecranon process, with scant purulent drainage when pressure is applied to the surrounding tissue.  Exudative tissue noted at the wound edges.  Wound  itself measures approximately 1 cm x 1 cm.  Psychiatric: Normal affect, non-agitated, not confused       LABS:  CMP Latest Ref Rng & Units 06/13/2019 06/12/2019 06/11/2019  Glucose 70 - 99  mg/dL 814(G) 818(H) 93  BUN 6 - 20 mg/dL 10 9 11   Creatinine 0.61 - 1.24 mg/dL 6.31 4.97  Sodium 135 - 145 mmol/L 136 136 140  Potassium 3.5 - 5.1 mmol/L 3.8 3.7 3.9  Chloride 98 - 111 mmol/L 102 99 102  CO2 22 - 32 mmol/L 27 27 29   Calcium 8.9 - 10.3 mg/dL 9.0 9.4 9.3  Total Protein 6.5 - 8.1 g/dL - 7.8 8.1  Total Bilirubin 0.3 - 1.2 mg/dL - 0.8 0.7  Alkaline Phos 38 - 126 U/L - 66 66  AST 15 - 41 U/L - 18 19  ALT 0 - 44 U/L - 11 12   CBC Latest Ref Rng & Units 06/13/2019 06/12/2019 06/11/2019  WBC 4.0 - 10.5 K/uL 8.3 8.8 6.6  Hemoglobin 13.0 - 17.0 g/dL 08/12/2019 08/11/2019 37.8  Hematocrit 39.0 - 52.0 % 38.7(L) 40.7 41.3  Platelets 150 - 400 K/uL 242 260 254    RADS: n/a  Assessment:      Right forearm abscess and cellulitis that is post I&D, also presenting with MRSA bacteremia on recent blood work.  Plan:     Verbal consent obtained to explore the wound and proceed with debridement of the exudative tissue at the former I&D site to further facilitate drainage of any remaining purulent material.  Procedure as noted below  Preoperative diagnosis: Right forearm abscess Postoperative diagnosis: same  Procedure: Right forearm abscess wound exploration, debridement  Anesthesia: None  Surgeon: 58.8  Wound Classification: Contaminated  Indications: Patient is a 34 y.o. male  presented with above  Specimen: None  Complications: None  Estimated Blood Loss: Minimal  Findings:  1.  Scant purulent secretions from 1 cm x 1 cm former I&D site with exudative tissue at the wound edges 2 Adequate hemostasis.   Description of procedure: The patient was placed in the supine position.  The open wound was gently probed with a Q-tip and noted to have a depth of approximately 1 cm, minimal tenderness to palpation.  Pressure around the surrounding tissue expressed scant purulent drainage from the now open wound.  1cm^2 exudative tissue at the wound edges were sharply dissected off the wound  bed using scissors down to the subcutaneous tissue.  At end of procedure, no obvious purulent drainage, or exudative tissue noted in the healthy wound bed with minimal bleeding and tenderness to palpation.  Wound was then packed with quarter inch iodoform packing, covered with 2 x 2 gauze and secured with tape.  Patient tolerated the procedure well.  Recommend continued IV antibiotic therapy for his MRSA bacteremia.  Dressing change in 48 hours.

## 2019-06-13 NOTE — ED Notes (Signed)
Report given to Amanda RN

## 2019-06-14 LAB — URINE DRUG SCREEN, QUALITATIVE (ARMC ONLY)
Amphetamines, Ur Screen: NOT DETECTED
Barbiturates, Ur Screen: NOT DETECTED
Benzodiazepine, Ur Scrn: NOT DETECTED
Cannabinoid 50 Ng, Ur ~~LOC~~: POSITIVE — AB
Cocaine Metabolite,Ur ~~LOC~~: NOT DETECTED
MDMA (Ecstasy)Ur Screen: NOT DETECTED
Methadone Scn, Ur: NOT DETECTED
Opiate, Ur Screen: NOT DETECTED
Phencyclidine (PCP) Ur S: NOT DETECTED
Tricyclic, Ur Screen: NOT DETECTED

## 2019-06-14 LAB — CULTURE, BLOOD (ROUTINE X 2)

## 2019-06-14 LAB — AEROBIC CULTURE W GRAM STAIN (SUPERFICIAL SPECIMEN)

## 2019-06-14 MED ORDER — DEXTROSE 5 % IV SOLN: 1500.0000 mg | Freq: Once | INTRAVENOUS | Status: DC

## 2019-06-14 MED ORDER — DEXTROSE 5 % IV SOLN
1500.0000 mg | Freq: Once | INTRAVENOUS | Status: AC
  Administered 2019-06-14: 1500 mg via INTRAVENOUS
  Filled 2019-06-14: qty 75

## 2019-06-14 MED ORDER — IBUPROFEN 400 MG PO TABS: 400.0000 mg | ORAL_TABLET | Freq: Three times a day (TID) | ORAL | 0 refills | Status: AC | PRN

## 2019-06-14 NOTE — Discharge Instructions (Signed)
Bacteremia, Adult Bacteremia is the presence of bacteria in the blood. When bacteria enter the bloodstream, they can cause a life-threatening reaction called sepsis. Sepsis is a medical emergency. What are the causes? This condition is caused by bacteria that get into the blood. Bacteria can enter the blood from an infection, including:  A skin infection or injury, such as a burn or a cut.  A lung infection (pneumonia).  An infection in the stomach or intestines.  An infection in the bladder or urinary system (urinary tract infection).  A bacterial infection in another part of the body that spreads to the blood. Bacteria can also enter the blood during a dental or medical procedure, from bleeding gums, or through use of an unclean needle. What increases the risk? This condition is more likely to develop in children, older adults, and people who have:  A long-term (chronic) disease or condition like diabetes or chronic kidney failure.  An artificial joint or heart valve, or heart valve disease.  A tube inserted to treat a medical condition, such as a urinary catheter or IV.  A weak disease-fighting system (immune system).  Injected illegal drugs.  Been hospitalized for more than 10 days in a row. What are the signs or symptoms? Symptoms of this condition include:  Fever and chills.  Fast heartbeat and shortness of breath.  Dizziness, weakness, and low blood pressure.  Confusion or anxiety.  Pain in the abdomen, nausea, vomiting, and diarrhea. Bacteremia that has spread to other parts of the body may cause symptoms in those areas. In some cases, there are no symptoms. How is this diagnosed? This condition may be diagnosed with a physical exam and tests, such as:  Blood tests to check for bacteria (cultures) or other signs of infection.  Tests of any tubes that you have had inserted. These tests check for a source of infection.  Urine tests to check for bacteria in the  urine.  Imaging tests, such as an X-ray, a CT scan, an MRI, or a heart ultrasound. These check for a source of infection in other parts of your body, such as your lungs, heart valves, or joints. How is this treated? This condition is usually treated in the hospital. If you are treated at home, you may need to return to the hospital for medicines, blood tests, and evaluation. Treatment may include:  Antibiotic medicines. These may be given by mouth or directly into your blood through an IV. You may need antibiotics for several weeks. At first, you may be given an antibiotic to kill most types of blood bacteria. If tests show that a certain kind of bacteria is causing the problem, you may be given a different antibiotic.  IV fluids.  Removing any catheter or device that could be a source of infection.  Blood pressure and breathing support, if needed.  Surgery to control the source or the spread of infection, such as surgery to remove an implanted device, abscess, or infected tissue. Follow these instructions at home: Medicines  Take over-the-counter and prescription medicines only as told by your health care provider.  If you were prescribed an antibiotic medicine, take it as told by your health care provider. Do not stop taking the antibiotic even if you start to feel better. General instructions   Rest as needed. Ask your health care provider when you may return to normal activities.  Drink enough fluid to keep your urine pale yellow.  Do not use any products that contain nicotine or   tobacco, such as cigarettes, e-cigarettes, and chewing tobacco. If you need help quitting, ask your health care provider.  Keep all follow-up visits as told by your health care provider. This is important. How is this prevented?   Wash your hands regularly with soap and water. If soap and water are not available, use hand sanitizer.  You should wash your hands: ? After using the toilet or changing a  diaper. ? Before preparing, cooking, serving, or eating food. ? While caring for a sick person or while visiting someone in a hospital. ? Before and after changing bandages (dressings) over wounds.  Clean any scrapes or cuts with soap and water and cover them with a clean bandage.  Get vaccinations as recommended by your health care provider.  Practice good oral hygiene. Brush your teeth two times a day, and floss regularly.  Take good care of your skin. This includes bathing and moisturizing on a regular basis. Contact a health care provider if:  Your symptoms get worse, and medicines do not help.  You have severe pain. Get help right away if you have:  Pain.  A fever or chills.  Trouble breathing.  A fast heart rate.  Skin that is blotchy, pale, or clammy.  Confusion.  Weakness.  Lack of energy or unusual sleepiness.  New symptoms that develop after treatment has started. These symptoms may represent a serious problem that is an emergency. Do not wait to see if the symptoms will go away. Get medical help right away. Call your local emergency services (911 in the [place] Do not drive yourself to the hospital. Summary  Bacteremia is the presence of bacteria in the blood. When bacteria enter the bloodstream, they can cause a life-threatening reaction called sepsis.  Bacteremia is usually treated with antibiotic medicines in the hospital.  If you were prescribed an antibiotic medicine, take it as told by your health care provider. Do not stop taking the antibiotic even if you start to feel better.  Get help right away if you have any new symptoms that develop after treatment has started. This information is not intended to replace advice given to you by your health care provider. Make sure you discuss any questions you have with your health care provider. Document Revised: 05/16/2018 Document Reviewed: 05/16/2018 Elsevier Patient Education  2020 [employer] Inc[employer]  Skin  Abscess  A skin abscess is an infected area of your skin that contains pus and other material. An abscess can happen in any part of your body. Some abscesses break open (rupture) on their own. Most continue to get worse unless they are treated. The infection can spread deeper into the body and into your blood, which can make you feel sick. A skin abscess is caused by germs that enter the skin through a cut or scrape. It can also be caused by blocked oil and sweat glands or infected hair follicles. This condition is usually treated by:  Draining the pus.  Taking antibiotic medicines.  Placing a warm, wet washcloth over the abscess. Follow these instructions at home: Medicines   Take over-the-counter and prescription medicines only as told by your doctor.  If you were prescribed an antibiotic medicine, take it as told by your doctor. Do not stop taking the antibiotic even if you start to feel better. Abscess care   If you have an abscess that has not drained, place a warm, clean, wet washcloth over the abscess several times a day. Do this as told by your doctor.    Follow instructions from your doctor about how to take care of your abscess. Make sure you: ? Cover the abscess with a bandage (dressing). ? Change your bandage or gauze as told by your doctor. ? Wash your hands with soap and water before you change the bandage or gauze. If you cannot use soap and water, use hand sanitizer.  Check your abscess every day for signs that the infection is getting worse. Check for: ? More redness, swelling, or pain. ? More fluid or blood. ? Warmth. ? More pus or a bad smell. General instructions  To avoid spreading the infection: ? Do not share personal care items, towels, or hot tubs with others. ? Avoid making skin-to-skin contact with other people.  Keep all follow-up visits as told by your doctor. This is important. Contact a doctor if:  You have more redness, swelling, or pain around  your abscess.  You have more fluid or blood coming from your abscess.  Your abscess feels warm when you touch it.  You have more pus or a bad smell coming from your abscess.  You have a fever.  Your muscles ache.  You have chills.  You feel sick. Get help right away if:  You have very bad (severe) pain.  You see red streaks on your skin spreading away from the abscess. Summary  A skin abscess is an infected area of your skin that contains pus and other material.  The abscess is caused by germs that enter the skin through a cut or scrape. It can also be caused by blocked oil and sweat glands or infected hair follicles.  Follow your doctor's instructions on caring for your abscess, taking medicines, preventing infections, and keeping follow-up visits. This information is not intended to replace advice given to you by your health care provider. Make sure you discuss any questions you have with your health care provider. Document Revised: 07/31/2018 Document Reviewed: 02/07/2017 Elsevier Patient Education  2020 Elsevier Inc.  

## 2019-06-14 NOTE — Plan of Care (Signed)

## 2019-06-14 NOTE — Progress Notes (Addendum)
Subjective:  CC: Ronald Baker is a 34 y.o. male  Hospital stay day 2,   Right arm cellulitis and abscess, MRSA bacteremia  HPI: No issues overnight.  Swelling continues to improve.  ROS:  General: Denies weight loss, weight gain, fatigue, fevers, chills, and night sweats. Heart: Denies chest pain, palpitations, racing heart, irregular heartbeat, leg pain or swelling, and decreased activity tolerance. Respiratory: Denies breathing difficulty, shortness of breath, wheezing, cough, and sputum. GI: Denies change in appetite, heartburn, nausea, vomiting, constipation, diarrhea, and blood in stool. GU: Denies difficulty urinating, pain with urinating, urgency, frequency, blood in urine.   Objective:   Temp:  [98.1 F (36.7 C)-98.5 F (36.9 C)] 98.5 F (36.9 C) (06/06 0848) Pulse Rate:  [54-63] 58 (06/06 0848) Resp:  [15-18] 16 (06/06 0848) BP: (124-145)/(77-89) 124/87 (06/06 0848) SpO2:  [99 %-100 %] 100 % (06/06 0848)     Height: 5\' 9"  (175.3 cm) Weight: 72.6 kg BMI (Calculated): 23.63   Intake/Output this shift:   Intake/Output Summary (Last 24 hours) at 06/14/2019 1031 Last data filed at 06/14/2019 0709 Gross per 24 hour  Intake 667.55 ml  Output 1 ml  Net 666.55 ml    Constitutional :  alert, cooperative, appears stated age and no distress  Respiratory:  clear to auscultation bilaterally  Cardiovascular:  regular rate and rhythm  Gastrointestinal: soft, non-tender; bowel sounds normal; no masses,  no organomegaly.   Skin: Cool and moist.  Improving overall cellulitis and swelling in the left forearm area, abscess I&D site measuing 1cm x 1cm x 1cm deep  Psychiatric: Normal affect, non-agitated, not confused       LABS:  CMP Latest Ref Rng & Units 06/13/2019 06/12/2019 06/11/2019  Glucose 70 - 99 mg/dL 08/11/2019) 409(W) 93  BUN 6 - 20 mg/dL 10 9 11   Creatinine 0.61 - 1.24 mg/dL 119(J 4.78  Sodium 135 - 145 mmol/L 136 136 140  Potassium 3.5 - 5.1 mmol/L 3.8 3.7 3.9  Chloride 98 -  111 mmol/L 102 99 102  CO2 22 - 32 mmol/L 27 27 29   Calcium 8.9 - 10.3 mg/dL 9.0 9.4 9.3  Total Protein 6.5 - 8.1 g/dL - 7.8 8.1  Total Bilirubin 0.3 - 1.2 mg/dL - 0.8 0.7  Alkaline Phos 38 - 126 U/L - 66 66  AST 15 - 41 U/L - 18 19  ALT 0 - 44 U/L - 11 12   CBC Latest Ref Rng & Units 06/13/2019 06/12/2019 06/11/2019  WBC 4.0 - 10.5 K/uL 8.3 8.8 6.6  Hemoglobin 13.0 - 17.0 g/dL 08/13/2019 08/12/2019 08/11/2019  Hematocrit 39.0 - 52.0 % 38.7(L) 40.7 41.3  Platelets 150 - 400 K/uL 242 260 254      RADS: N/a  Assessment:   Right arm cellulitis and abscess, MRSA bacteremia.  Right arm clinical exam improving, currently on antibiotics.  Defer abx management to ID.  Continue wet-to-dry dressing changes for the actual I&D site, until area healed.  Instruced patients on dressing changes and he verbalized understanding.  Ok to be d/c'd from surgery standpoint, f/u two weeks for wound check

## 2019-06-14 NOTE — Progress Notes (Signed)
Patient discharging home. IV abx finished infusing. Instructions given to patient, verbalized understanding. Mom to transport patient home.

## 2019-06-14 NOTE — Discharge Summary (Signed)
[facility] [place] at [facility]   PATIENT NAME: Ronald Baker    MR#:  [id number]  DATE OF BIRTH:  09-Apr-1985  DATE OF ADMISSION:  06/12/2019 ADMITTING PHYSICIAN: Enedina Finner, MD  DATE OF DISCHARGE: 06/14/2019  PRIMARY CARE PHYSICIAN: Ronald Baker    ADMISSION DIAGNOSIS:  Bacteremia [R78.81] Abscess [L02.91] MRSA bacteremia [R78.81, B95.62]  DISCHARGE DIAGNOSIS:  MRSA Bacteremia--source right elbow abscess  SECONDARY DIAGNOSIS:  History reviewed. No pertinent past medical history.  HOSPITAL COURSE:  DavidEnglishis a34 y.o.malewith a known history of ongoing tobacco abuse, who was seen in the emergency room for right posterior elbow abscess last night and underwent incision and drainage. He was discharged on p.o. Keflex and Bactrim DS. He had a blood culture that grew MRSA. The patient denied any fever or chills at home however his temperature here was up to 100.2.  1. MRSA bacteremia likely secondary to right elbow cellulitis and abscess. -We will continue antibiotic therapy with IV vancomycin. -ID consultation with Dr. Rivka Safer (curbsided) -echo of the heart--valves look normal -pt is frustrated and wants to leave. He is at a HIGH risk for leaving AMA. Therefore discussion was made with infectious disease and patient will receive one dose of IV Dalbavancin 1500 mg today. -Patient has no insurance has meds oral antibiotics linezolid was unable to be prescribed. He is recommended to follow-up with infectious disease and surgery as outpatient. -Patient did voice understanding.   2. Right posterior elbow cellulitis and abscess status post incision and drainage. -Management as above. -- Gen. surgery consultation with Dr. Tonna Boehringer appreciated. Right forearm abscess was evaluated and packing done. -Wet to dry dressing's daily is advised for instructions by surgery  3. Ongoing tobacco abuse. - counseled the patient for smoking cessation and he  will receive further counseling here.  4. Elevated blood pressure. -This could be related to pain.  -Improved  5. DVT prophylaxis. -Subcutaneous Lovenox.   CODE STATUS: Full code  Status is: Inpatient  Dispo: The patient is from:Home Anticipated d/c is MM:HWKG Anticipated d/c date is: today Patient currentlyis not medically stable to d/c however he is arisk for leaving AMA and hence he will get IV dalbavancin today Status is: Inpatient  Procedures:I and D with packing Family communication :left VM for mother Consults :Surgery CODE STATUS: FULL DVT Prophylaxis :lovenox    CONSULTS OBTAINED:  Treatment Team:  [name], DO [name], MD  DRUG ALLERGIES:  No Known Allergies  DISCHARGE MEDICATIONS:   Allergies as of 06/14/2019   No Known Allergies     Medication List    STOP taking these medications   cephALEXin 500 MG capsule Commonly known as: KEFLEX   clindamycin 300 MG capsule Commonly known as: CLEOCIN   naproxen 500 MG tablet Commonly known as: Naprosyn   oxyCODONE-acetaminophen 5-325 MG tablet Commonly known as: Percocet   sulfamethoxazole-trimethoprim 800-160 MG tablet Commonly known as: BACTRIM DS   traMADol 50 MG tablet Commonly known as: Ultram     TAKE these medications   ibuprofen 400 MG tablet Commonly known as: ADVIL Take 1 tablet (400 mg total) by mouth every 8 (eight) hours as needed.            Discharge Care Instructions  (From admission, onward)         Start     Ordered   06/14/19 0000  Discharge wound care:    Comments: Wet to dry daily   06/14/19 1236  If you experience worsening of your admission symptoms, develop shortness of breath, life threatening emergency, suicidal or homicidal thoughts you must seek medical attention immediately by calling 911 or calling your MD immediately  if symptoms less severe.  You Must read complete  instructions/literature along with all the possible adverse reactions/side effects for all the Medicines you take and that have been prescribed to you. Take any new Medicines after you have completely understood and accept all the possible adverse reactions/side effects.   Please note  You were cared for by a hospitalist during your hospital stay. If you have any questions about your discharge medications or the care you received while you were in the hospital after you are discharged, you can call the unit and asked to speak with the hospitalist on call if the hospitalist that took care of you is not available. Once you are discharged, your primary care physician will handle any further medical issues. Please note that NO REFILLS for any discharge medications will be authorized once you are discharged, as it is imperative that you return to your primary care physician (or establish a relationship with a primary care physician if you do not have one) for your aftercare needs so that they can reassess your need for medications and monitor your lab values. Today   SUBJECTIVE   I want to go home I cannot stay here another day--too much!!  VITAL SIGNS:  Blood pressure 124/87, pulse (!) 58, temperature 98.5 F (36.9 C), resp. rate 16, height 5\' 9"  (1.753 m), weight 72.6 kg, SpO2 100 %.  I/O:    Intake/Output Summary (Last 24 hours) at 06/14/2019 1237 Last data filed at 06/14/2019 0709 Gross Baker 24 hour  Intake 667.55 ml  Output 1 ml  Net 666.55 ml    PHYSICAL EXAMINATION:  GENERAL:  34 y.o.-year-old patient lying in the bed with no acute distress.  EYES: Pupils equal, round, reactive to light and accommodation. No scleral icterus.  HEENT: Head atraumatic, normocephalic. Oropharynx and nasopharynx clear.  NECK:  Supple, no jugular venous distention. No thyroid enlargement, no tenderness.  LUNGS: Normal breath sounds bilaterally, no wheezing, rales,rhonchi or crepitation. No use of accessory  muscles of respiration.  CARDIOVASCULAR: S1, S2 normal. No murmurs, rubs, or gallops.  ABDOMEN: Soft, non-tender, non-distended. Bowel sounds present. No organomegaly or mass.  EXTREMITIES Above picture from June 6th  NEUROLOGIC: Cranial nerves II through XII are intact. Muscle strength 5/5 in all extremities. Sensation intact. Gait not checked.  PSYCHIATRIC: The patient is alert and oriented x 3.  SKIN: No obvious rash, lesion, or ulcer.   DATA REVIEW:   CBC  Recent Labs  Lab 06/13/19 0211  WBC 8.3  HGB 13.3  HCT 38.7*  PLT 242    Chemistries  Recent Labs  Lab 06/12/19 1639 06/12/19 1639 06/13/19 0211  NA 136   < > 136  K 3.7   < > 3.8  CL 99   < > 102  CO2 27   < > 27  GLUCOSE 102*   < > 101*  BUN 9   < > 10  CREATININE 1.04   < > 1.09  CALCIUM 9.4   < > 9.0  AST 18  --   --   ALT 11  --   --   ALKPHOS 66  --   --   BILITOT 0.8  --   --    < > = values in this interval not displayed.  Microbiology Results   Recent Results (from the past 240 hour(s))  Blood culture (routine x 2)     Status: Abnormal   Collection Time: 06/11/19  7:47 PM   Specimen: BLOOD  Result Value Ref Range Status   Specimen Description   Final    BLOOD RIGHT ANTECUBITAL Performed at [facility], [address] [place], [place] [address]    Special Requests   Final    BOTTLES DRAWN AEROBIC AND ANAEROBIC Blood Culture results may not be optimal due to an excessive volume of blood received in culture bottles Performed at [facility], [address] [place], [place] [address]    Culture  Setup Time   Final    IN BOTH AEROBIC AND ANAEROBIC BOTTLES GRAM POSITIVE COCCI IN CLUSTERS CRITICAL RESULT CALLED TO, READ BACK BY AND VERIFIED WITH: ANGELA ROBBINS AT 1120 ON 06/12/2019 [facility]. Performed at [facility], [address], [place], [place] [address]    Culture METHICILLIN RESISTANT STAPHYLOCOCCUS AUREUS (A)  Final   Report Status 06/14/2019 FINAL  Final    Organism ID, Bacteria METHICILLIN RESISTANT STAPHYLOCOCCUS AUREUS  Final      Susceptibility   Methicillin resistant staphylococcus aureus - MIC*    CIPROFLOXACIN >=8 RESISTANT Resistant     ERYTHROMYCIN >=8 RESISTANT Resistant     GENTAMICIN <=0.5 SENSITIVE Sensitive     OXACILLIN >=4 RESISTANT Resistant     TETRACYCLINE <=1 SENSITIVE Sensitive     VANCOMYCIN <=0.5 SENSITIVE Sensitive     TRIMETH/SULFA <=10 SENSITIVE Sensitive     CLINDAMYCIN <=0.25 SENSITIVE Sensitive     RIFAMPIN <=0.5 SENSITIVE Sensitive     Inducible Clindamycin NEGATIVE Sensitive     * METHICILLIN RESISTANT STAPHYLOCOCCUS AUREUS  Blood Culture ID Panel (Reflexed)     Status: Abnormal   Collection Time: 06/11/19  7:47 PM  Result Value Ref Range Status   Enterococcus species NOT DETECTED NOT DETECTED Final   Listeria monocytogenes NOT DETECTED NOT DETECTED Final   Staphylococcus species DETECTED (A) NOT DETECTED Final    Comment: ANGELA ROBBINS AT 1120 ON 06/12/2019 [facility].   Staphylococcus aureus (BCID) DETECTED (A) NOT DETECTED Final    Comment: Methicillin (oxacillin)-resistant Staphylococcus aureus (MRSA). MRSA is predictably resistant to beta-lactam antibiotics (except ceftaroline). Preferred therapy is vancomycin unless clinically contraindicated. Patient requires contact precautions if  hospitalized. ANGELA ROBBINS AT 1120 ON 06/12/2019 [facility].    Methicillin resistance DETECTED (A) NOT DETECTED Final    Comment: CRITICAL RESULT CALLED TO, READ BACK BY AND VERIFIED WITH: ANGELA ROBBINS AT 1120 ON 06/12/2019 [facility].    Streptococcus species NOT DETECTED NOT DETECTED Final   Streptococcus agalactiae NOT DETECTED NOT DETECTED Final   Streptococcus pneumoniae NOT DETECTED NOT DETECTED Final   Streptococcus pyogenes NOT DETECTED NOT DETECTED Final   Acinetobacter baumannii NOT DETECTED NOT DETECTED Final   Enterobacteriaceae species NOT DETECTED NOT DETECTED Final   Enterobacter cloacae complex NOT DETECTED NOT DETECTED  Final   Escherichia coli NOT DETECTED NOT DETECTED Final   Klebsiella oxytoca NOT DETECTED NOT DETECTED Final   Klebsiella pneumoniae NOT DETECTED NOT DETECTED Final   Proteus species NOT DETECTED NOT DETECTED Final   Serratia marcescens NOT DETECTED NOT DETECTED Final   Haemophilus influenzae NOT DETECTED NOT DETECTED Final   Neisseria meningitidis NOT DETECTED NOT DETECTED Final   Pseudomonas aeruginosa NOT DETECTED NOT DETECTED Final   Candida albicans NOT DETECTED NOT DETECTED Final   Candida glabrata NOT DETECTED NOT DETECTED Final  Candida krusei NOT DETECTED NOT DETECTED Final   Candida parapsilosis NOT DETECTED NOT DETECTED Final   Candida tropicalis NOT DETECTED NOT DETECTED Final    Comment: Performed at [facility], [address] [place], [place] [address]  Wound or Superficial Culture     Status: None   Collection Time: 06/12/19  3:00 AM   Specimen: Abscess; Wound  Result Value Ref Range Status   Specimen Description   Final    ABSCESS Performed at [facility], [address], [place], [place] [address]    Special Requests   Final    NONE Performed at [facility], [place], [place] [address]    Gram Stain   Final    MODERATE WBC PRESENT, PREDOMINANTLY PMN RARE GRAM POSITIVE COCCI Performed at [facility], [place] [address] [place], [place] [address]    Culture   Final    MODERATE METHICILLIN RESISTANT STAPHYLOCOCCUS AUREUS   Report Status 06/14/2019 FINAL  Final   Organism ID, Bacteria METHICILLIN RESISTANT STAPHYLOCOCCUS AUREUS  Final      Susceptibility   Methicillin resistant staphylococcus aureus - MIC*    CIPROFLOXACIN >=8 RESISTANT Resistant     ERYTHROMYCIN >=8 RESISTANT Resistant     GENTAMICIN <=0.5 SENSITIVE Sensitive     OXACILLIN >=4 RESISTANT Resistant     TETRACYCLINE <=1 SENSITIVE Sensitive     VANCOMYCIN 1 SENSITIVE Sensitive     TRIMETH/SULFA <=10 SENSITIVE Sensitive     CLINDAMYCIN  <=0.25 SENSITIVE Sensitive     RIFAMPIN <=0.5 SENSITIVE Sensitive     Inducible Clindamycin NEGATIVE Sensitive     * MODERATE METHICILLIN RESISTANT STAPHYLOCOCCUS AUREUS  Blood culture (routine x 2)     Status: None (Preliminary result)   Collection Time: 06/12/19  4:39 PM   Specimen: BLOOD  Result Value Ref Range Status   Specimen Description BLOOD LEFT ANTECUBITAL  Final   Special Requests   Final    BOTTLES DRAWN AEROBIC AND ANAEROBIC Blood Culture adequate volume   Culture   Final    NO GROWTH 2 DAYS Performed at [facility], [address], [place], [place] [address]    Report Status PENDING  Incomplete  SARS Coronavirus 2 by RT PCR (hospital order, performed in [place] hospital lab) Nasopharyngeal Nasopharyngeal Swab     Status: None   Collection Time: 06/12/19  7:48 PM   Specimen: Nasopharyngeal Swab  Result Value Ref Range Status   SARS Coronavirus 2 NEGATIVE NEGATIVE Final    Comment: (NOTE) SARS-CoV-2 target nucleic acids are NOT DETECTED. The SARS-CoV-2 RNA is generally detectable in upper and lower respiratory specimens during the acute phase of infection. The lowest concentration of SARS-CoV-2 viral copies this assay can detect is 250 copies / mL. A negative result does not preclude SARS-CoV-2 infection and should not be used as the sole basis for treatment or other patient management decisions.  A negative result may occur with improper specimen collection / handling, submission of specimen other than nasopharyngeal swab, presence of viral mutation(s) within the areas targeted by this assay, and inadequate number of viral copies (<250 copies / mL). A negative result must be combined with clinical observations, patient history, and epidemiological information. Fact Sheet for Patients:   StrictlyIdeas.no Fact Sheet for Healthcare Providers: BankingDealers.co.za This test is not yet approved or cleared  by  the [place] FDA and has been authorized for detection and/or diagnosis of SARS-CoV-2 by FDA under an Emergency  Use Authorization (EUA).  This EUA will remain in effect (meaning this test can be used) for the duration of the COVID-19 declaration under Section 564(b)(1) of the Act, 21 U.S.C. section 360bbb-3(b)(1), unless the authorization is terminated or revoked sooner. Performed at [facility], [address] [place], [place] [address]     RADIOLOGY:  ECHOCARDIOGRAM COMPLETE  Result Date: 06/13/2019    ECHOCARDIOGRAM REPORT   Patient Name:   Ronald Baker Date of Exam: 06/13/2019 Medical Rec #:  [id number]     Height:       69.0 in Accession #:    [id number]    Weight:       160.1 lb Date of Birth:  12/23/1985      BSA:          1.879 m Patient Age:    34 years      BP:           139/89 mmHg Patient Gender: M             HR:           59 bpm. Exam Location:  [facility] Procedure: 2D Echo Indications:     Abnormal ECG  History:         Patient has no prior history of Echocardiogram examinations.                  Risk Factors:Current Smoker.  Sonographer:     Elbert Ewings Thornton-Maynard Referring Phys:  [id number] QMVH Jahnessa Vanduyn Diagnosing Phys: Harold Hedge MD IMPRESSIONS  1. Left ventricular ejection fraction, by estimation, is 55 to 60%. Left ventricular ejection fraction by PLAX is 56 %. The left ventricle has normal function. The left ventricle has no regional wall motion abnormalities. Left ventricular diastolic parameters were normal.  2. Right ventricular systolic function is normal. The right ventricular size is normal. There is normal pulmonary artery systolic pressure.  3. Right atrial size was mildly dilated.  4. The mitral valve is grossly normal. Trivial mitral valve regurgitation.  5. The aortic valve is tricuspid. Aortic valve regurgitation is not visualized. FINDINGS  Left Ventricle: Left ventricular ejection fraction, by estimation, is 55 to 60%. Left ventricular ejection fraction by PLAX is 56  %. The left ventricle has normal function. The left ventricle has no regional wall motion abnormalities. The left ventricular internal cavity size was normal in size. There is no left ventricular hypertrophy. Left ventricular diastolic parameters were normal. Right Ventricle: The right ventricular size is normal. No increase in right ventricular wall thickness. Right ventricular systolic function is normal. There is normal pulmonary artery systolic pressure. The tricuspid regurgitant velocity is 2.00 m/s, and  with an assumed right atrial pressure of 10 mmHg, the estimated right ventricular systolic pressure is 26.0 mmHg. Left Atrium: Left atrial size was normal in size. Right Atrium: Right atrial size was mildly dilated. Pericardium: There is no evidence of pericardial effusion. Mitral Valve: The mitral valve is grossly normal. Trivial mitral valve regurgitation. There is no evidence of mitral valve vegetation. Tricuspid Valve: The tricuspid valve is grossly normal. Tricuspid valve regurgitation is mild. There is no evidence of tricuspid valve vegetation. Aortic Valve: The aortic valve is tricuspid. Aortic valve regurgitation is not visualized. Aortic valve mean gradient measures 3.0 mmHg. Aortic valve peak gradient measures 7.0 mmHg. Aortic valve area, by VTI measures 2.69 cm. There is no evidence of aortic valve vegetation. Pulmonic Valve: The pulmonic valve was not well visualized. Pulmonic valve regurgitation is trivial.  Aorta: The aortic root is normal in size and structure. IAS/Shunts: The interatrial septum was not assessed.  LEFT VENTRICLE PLAX 2D LV EF:         Left            Diastology                ventricular     LV e' lateral:   19.00 cm/s                ejection        LV E/e' lateral: 3.7                fraction by     LV e' medial:    14.10 cm/s                PLAX is 56      LV E/e' medial:  5.0                %. LVIDd:         4.79 cm LV PW:         1.07 cm LV IVS:        0.97 cm LVOT diam:      2.10 cm LV SV:         63 LV SV Index:   34 LVOT Area:     3.46 cm  RIGHT VENTRICLE RV S prime:     13.40 cm/s TAPSE (M-mode): 2.2 cm LEFT ATRIUM             Index LA Vol (A2C):   58.4 ml 31.07 ml/m LA Vol (A4C):   29.5 ml 15.70 ml/m LA Biplane Vol: 43.6 ml 23.20 ml/m  AORTIC VALVE                   PULMONIC VALVE AV Area (Vmax):    2.83 cm    PV Vmax:       1.05 m/s AV Area (Vmean):   2.84 cm    PV Peak grad:  4.4 mmHg AV Area (VTI):     2.69 cm AV Vmax:           132.00 cm/s AV Vmean:          83.500 cm/s AV VTI:            0.234 m AV Peak Grad:      7.0 mmHg AV Mean Grad:      3.0 mmHg LVOT Vmax:         108.00 cm/s LVOT Vmean:        68.500 cm/s LVOT VTI:          0.182 m LVOT/AV VTI ratio: 0.78  AORTA Ao Root diam: 3.40 cm MITRAL VALVE               TRICUSPID VALVE MV Area (PHT): 3.86 cm    TR Peak grad:   16.0 mmHg MV E velocity: 70.30 cm/s  TR Vmax:        200.00 cm/s MV A velocity: 42.40 cm/s MV E/A ratio:  1.66        SHUNTS                            Systemic VTI:  0.18 m  Systemic Diam: 2.10 cm Harold Hedge MD Electronically signed by Harold Hedge MD Signature Date/Time: 06/13/2019/5:40:13 PM    Final      CODE STATUS:     Code Status Orders  (From admission, onward)         Start     Ordered   06/12/19 2111  Full code  Continuous     06/12/19 2118        Code Status History    This patient has a current code status but no historical code status.   Advance Care Planning Activity       TOTAL TIME TAKING CARE OF THIS PATIENT: 40 minutes.    Enedina Finner M.D  [facility]    CC: Primary care physician; Ronald Baker

## 2019-06-17 LAB — CULTURE, BLOOD (ROUTINE X 2)
Culture: NO GROWTH
Special Requests: ADEQUATE

## 2019-06-23 ENCOUNTER — Telehealth: Payer: Self-pay | Admitting: Infectious Diseases

## 2019-06-23 ENCOUNTER — Inpatient Hospital Stay: Payer: Self-pay | Admitting: Infectious Diseases

## 2019-06-23 NOTE — Telephone Encounter (Signed)
Pt did not show up for his appt today. He had MRSA bacteremia and MRSA abscess rt arm and was in the hospital between 6/4-6/6. I did not see him- He was leaving AMA and hence Dalbavancin 1500mg  was given on 6/13 and he was asked to follow up with me.  Called and left him a message to see me this thursday

## 2019-07-02 ENCOUNTER — Telehealth: Payer: Self-pay | Admitting: Infectious Diseases

## 2019-07-02 NOTE — Telephone Encounter (Signed)
After many failed attemps and no shows I reached patient today to find out how he was doing with recent MRSA SSI of the rt arm and MRSA bacteremia and receiving dalbavancin- Pt said he was busy and couldn't talk

## 2019-07-16 ENCOUNTER — Telehealth: Payer: Self-pay | Admitting: General Practice

## 2019-07-16 NOTE — Telephone Encounter (Signed)
Individual has been attempted to be contacted regarding ED referral and the phone number has been disconnected.

## 2020-11-30 IMAGING — US US EXTREM UP *R* LTD
1 series · 13 of 13 positions shown · non-contrast
Comparison: None.

CLINICAL DATA: For 5 days focal area of swelling and redness

EXAM:
ULTRASOUND right upper extremity UPPER EXTREMITY LIMITED
TECHNIQUE: Ultrasound examination of the upper extremity soft tissues was
performed in the area of clinical concern.

[Series 1: us soft tissue right upper extremity limited (non- · 13 acquisitions, 13 frames shown]
[im 1/13]
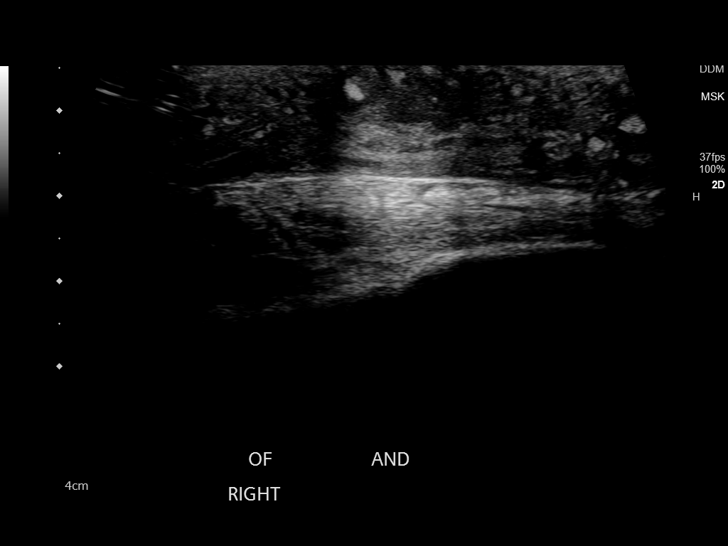
[im 2/13]
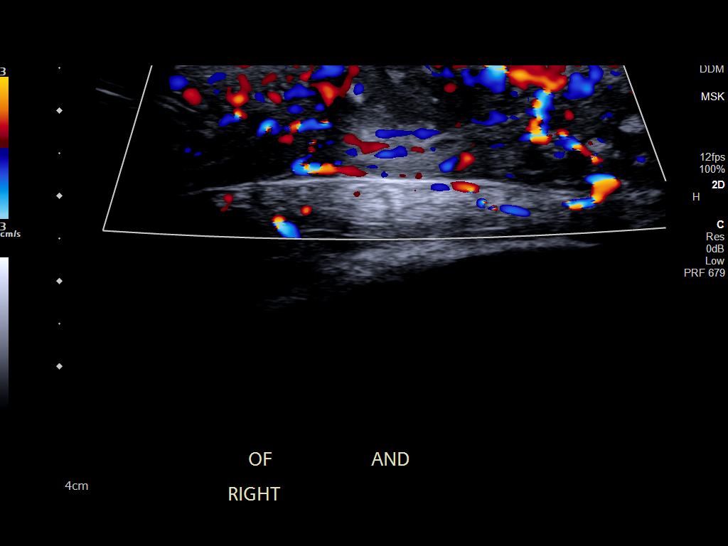
[im 3/13]
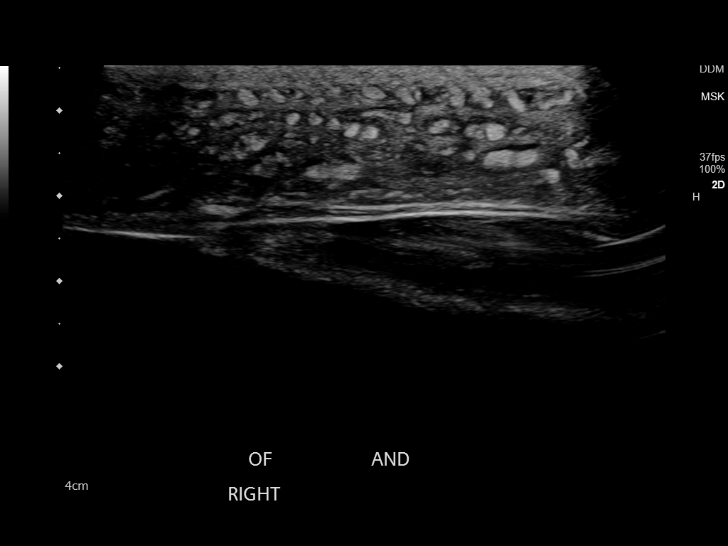
[im 4/13]
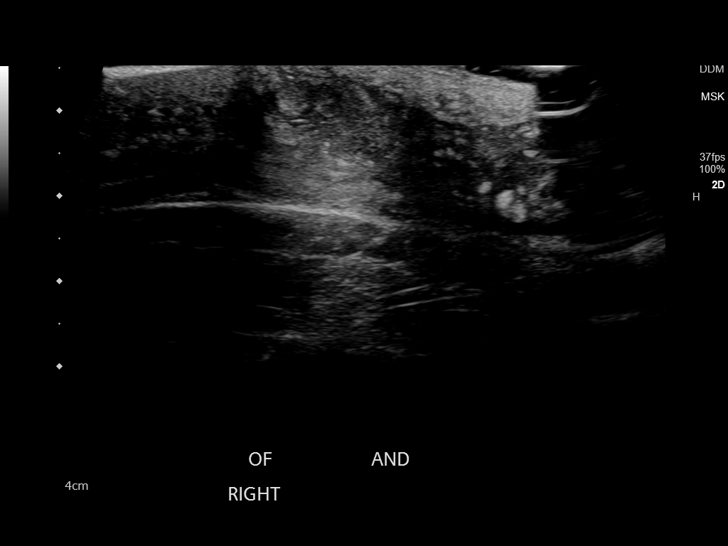
[im 5/13]
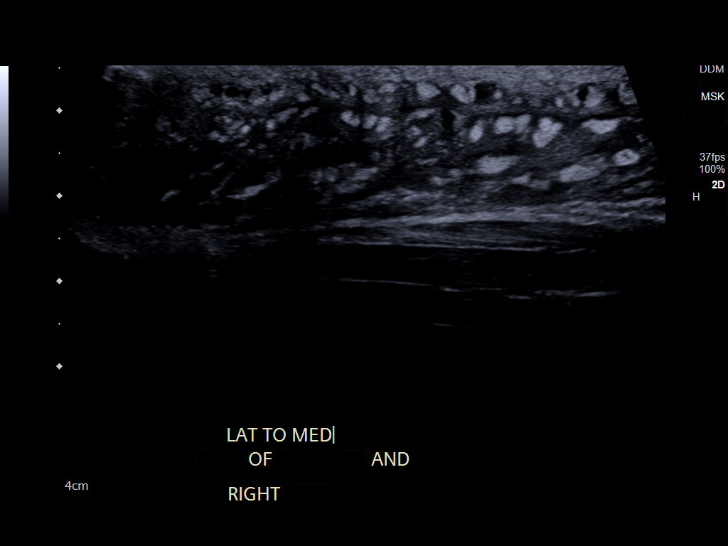
[im 6/13]
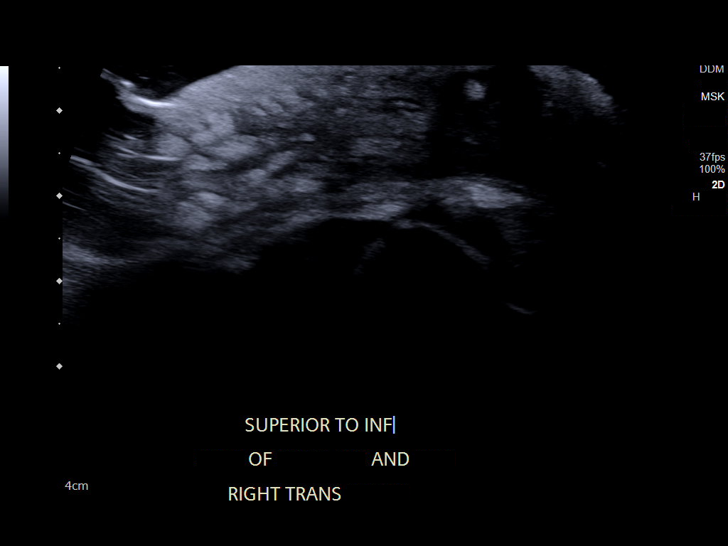
[im 7/13]
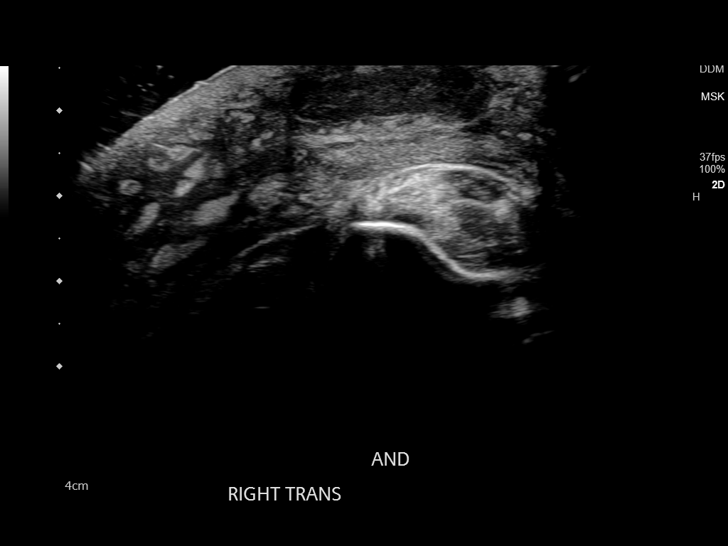
[im 8/13]
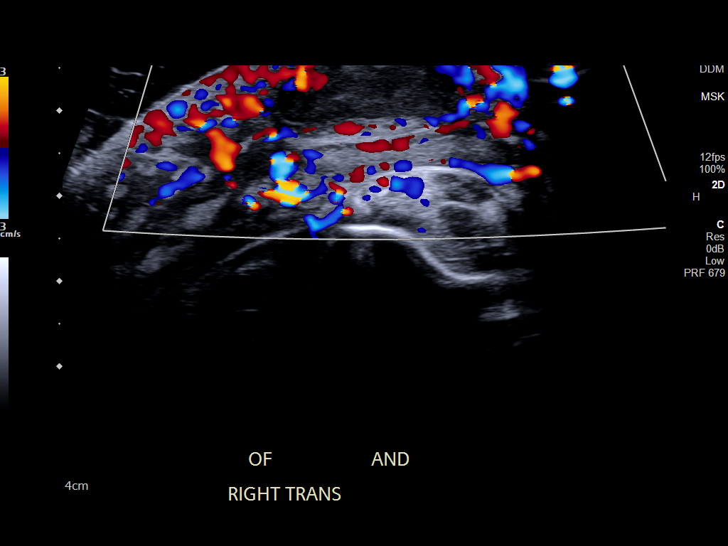
[im 9/13]
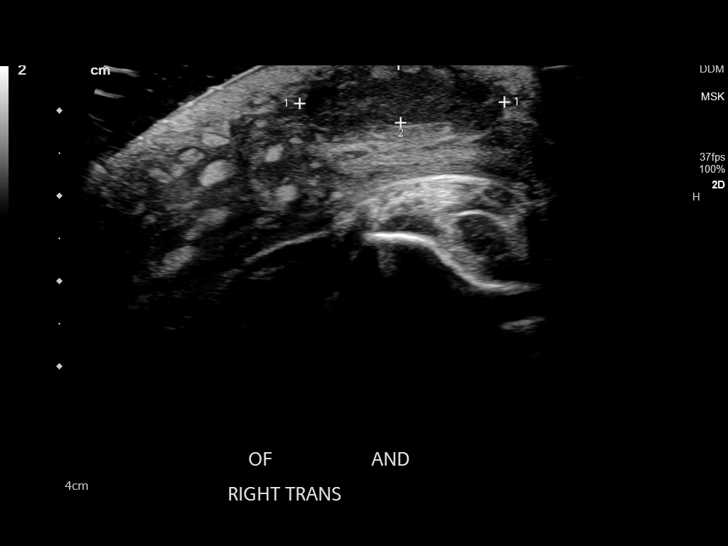
[im 10/13]
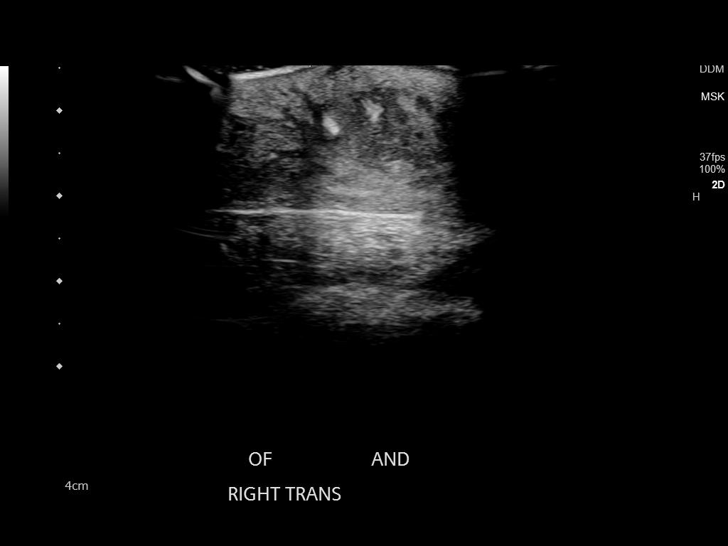
[im 11/13]
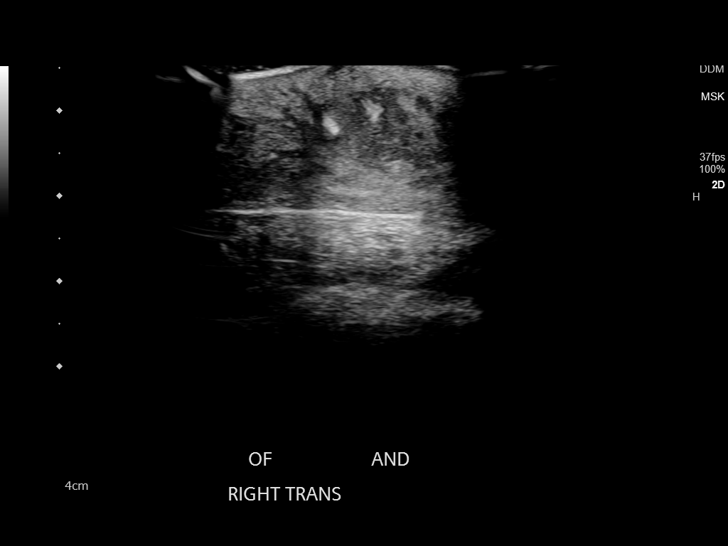
[im 12/13]
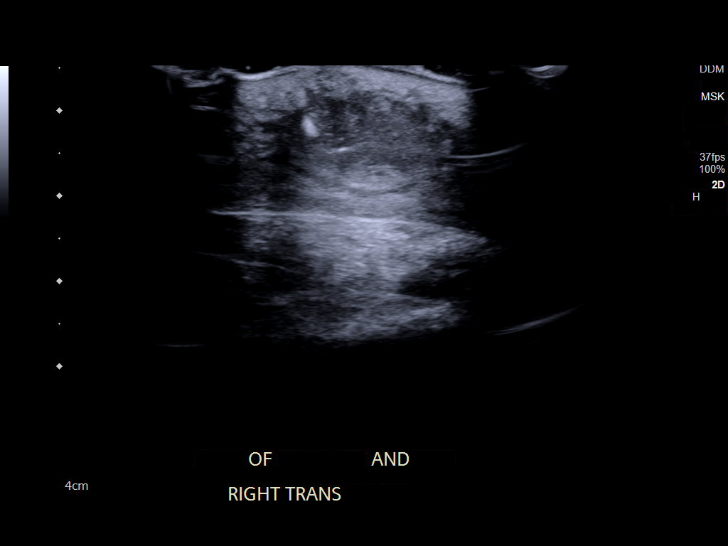
[im 13/13]
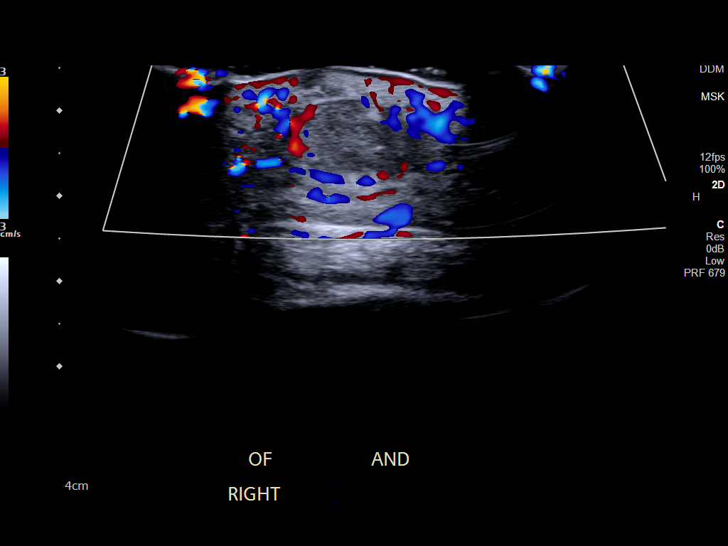

[13 of 13 positions shown; findings below may reference images not displayed]

FINDINGS: Within the area of interest there is a focal hypoechoic area
measuring 2.4 by 0.7 cm with peripheral vascularity. There is
diffuse subcutaneous edema and vascularity noted. Small overlying
area of ulceration is noted.
IMPRESSION: Small focal area of ulceration with a loculated subcutaneous fluid
collection measuring 2.4 x 0.7 cm, likely consistent with
superficial abscess/phlegmon.

Findings suggestive of diffuse cellulitis
